# Patient Record
Sex: Female | Born: 1970 | Race: White | Hispanic: No | Marital: Married | Smoking: Never smoker
Health system: Southern US, Community
[De-identification: ages and names within clinical notes are randomized; demographics above are authoritative.]

## PROBLEM LIST (undated history)

## (undated) DIAGNOSIS — K56609 Unspecified intestinal obstruction, unspecified as to partial versus complete obstruction: Secondary | ICD-10-CM

## (undated) DIAGNOSIS — G935 Compression of brain: Secondary | ICD-10-CM

## (undated) HISTORY — PX: HERNIA REPAIR: SHX51

---

## 2005-02-21 ENCOUNTER — Emergency Department: Payer: Self-pay | Admitting: Emergency Medicine

## 2005-05-12 ENCOUNTER — Emergency Department: Payer: Self-pay | Admitting: General Practice

## 2007-06-19 ENCOUNTER — Emergency Department: Payer: Self-pay | Admitting: Emergency Medicine

## 2007-07-11 ENCOUNTER — Ambulatory Visit: Payer: Self-pay | Admitting: Orthopedic Surgery

## 2007-08-01 ENCOUNTER — Ambulatory Visit: Payer: Self-pay | Admitting: Orthopedic Surgery

## 2008-12-09 ENCOUNTER — Inpatient Hospital Stay: Payer: Self-pay | Admitting: Vascular Surgery

## 2011-08-31 ENCOUNTER — Emergency Department: Payer: Self-pay | Admitting: *Deleted

## 2011-08-31 LAB — URINALYSIS, COMPLETE
Bilirubin,UR: NEGATIVE
Ketone: NEGATIVE
Nitrite: NEGATIVE
Ph: 5 (ref 4.5–8.0)
Squamous Epithelial: 7

## 2011-08-31 LAB — COMPREHENSIVE METABOLIC PANEL
Alkaline Phosphatase: 48 U/L — ABNORMAL LOW (ref 50–136)
BUN: 10 mg/dL (ref 7–18)
Bilirubin,Total: 0.5 mg/dL (ref 0.2–1.0)
Calcium, Total: 8.4 mg/dL — ABNORMAL LOW (ref 8.5–10.1)
Chloride: 110 mmol/L — ABNORMAL HIGH (ref 98–107)
Co2: 26 mmol/L (ref 21–32)
Creatinine: 0.7 mg/dL (ref 0.60–1.30)
EGFR (African American): >60
EGFR (Non-African Amer.): >60
Potassium: 3.8 mmol/L (ref 3.5–5.1)
Sodium: 142 mmol/L (ref 136–145)

## 2011-08-31 LAB — CBC
HGB: 14.2 g/dL (ref 12.0–16.0)
MCH: 29.4 pg (ref 26.0–34.0)
MCHC: 34.1 g/dL (ref 32.0–36.0)
MCV: 86 fL (ref 80–100)
Platelet: 194 10*3/uL (ref 150–440)
WBC: 7.4 10*3/uL (ref 3.6–11.0)

## 2011-08-31 LAB — LIPASE, BLOOD: Lipase: 151 U/L (ref 73–393)

## 2011-08-31 LAB — PREGNANCY, URINE: Pregnancy Test, Urine: NEGATIVE m[IU]/mL

## 2013-09-26 DIAGNOSIS — K589 Irritable bowel syndrome without diarrhea: Secondary | ICD-10-CM | POA: Insufficient documentation

## 2013-09-26 DIAGNOSIS — O9279 Other disorders of lactation: Secondary | ICD-10-CM | POA: Insufficient documentation

## 2013-09-26 DIAGNOSIS — O901 Disruption of perineal obstetric wound: Secondary | ICD-10-CM | POA: Insufficient documentation

## 2016-09-21 ENCOUNTER — Other Ambulatory Visit: Payer: Self-pay | Admitting: Family Medicine

## 2016-09-21 DIAGNOSIS — Z1231 Encounter for screening mammogram for malignant neoplasm of breast: Secondary | ICD-10-CM

## 2016-10-10 ENCOUNTER — Encounter (HOSPITAL_COMMUNITY): Payer: Self-pay

## 2016-10-10 ENCOUNTER — Ambulatory Visit
Admission: RE | Admit: 2016-10-10 | Discharge: 2016-10-10 | Disposition: A | Discharge status: Home / Self Care | Payer: Medicaid Other | Source: Ambulatory Visit | Attending: Family Medicine | Admitting: Family Medicine

## 2016-10-10 DIAGNOSIS — Z1231 Encounter for screening mammogram for malignant neoplasm of breast: Secondary | ICD-10-CM | POA: Diagnosis present

## 2016-10-20 ENCOUNTER — Other Ambulatory Visit: Payer: Self-pay | Admitting: Family Medicine

## 2016-10-20 DIAGNOSIS — R1012 Left upper quadrant pain: Secondary | ICD-10-CM

## 2016-10-30 ENCOUNTER — Ambulatory Visit
Admission: RE | Admit: 2016-10-30 | Discharge: 2016-10-30 | Disposition: A | Discharge status: Home / Self Care | Payer: Medicaid Other | Source: Ambulatory Visit | Attending: Family Medicine | Admitting: Family Medicine

## 2016-11-07 ENCOUNTER — Ambulatory Visit
Admission: RE | Admit: 2016-11-07 | Discharge: 2016-11-07 | Disposition: A | Discharge status: Home / Self Care | Payer: Medicaid Other | Source: Ambulatory Visit | Attending: Family Medicine | Admitting: Family Medicine

## 2016-11-07 DIAGNOSIS — R1012 Left upper quadrant pain: Secondary | ICD-10-CM | POA: Insufficient documentation

## 2016-11-07 MED ORDER — IOPAMIDOL (ISOVUE-370) INJECTION 76%
100.0000 mL | Freq: Once | INTRAVENOUS | Status: AC | PRN
  Administered 2016-11-07: 100 mL via INTRAVENOUS

## 2018-08-04 ENCOUNTER — Telehealth: Payer: Medicaid Other | Admitting: Family

## 2018-08-04 DIAGNOSIS — Z20822 Contact with and (suspected) exposure to covid-19: Secondary | ICD-10-CM

## 2018-08-04 MED ORDER — BENZONATATE 100 MG PO CAPS: 100.0000 mg | ORAL_CAPSULE | Freq: Three times a day (TID) | ORAL | 0 refills | Status: DC | PRN

## 2018-08-04 NOTE — Progress Notes (Signed)
E-Visit for Corona Virus Screening   Your current symptoms could be consistent with the coronavirus.  I have sent your note to our Community Testing site. They should reach out to you in the next 24 hours to set up a time for testing if you qualify.  Approximately 5 minutes was spent documenting and reviewing patient's chart.    COVID-19 is a respiratory illness with symptoms that are similar to the flu. Symptoms are typically mild to moderate, but there have been cases of severe illness and death due to the virus. The following symptoms may appear 2-14 days after exposure: . Fever . Cough . Shortness of breath or difficulty breathing . Chills . Repeated shaking with chills . Muscle pain . Headache . Sore throat . New loss of taste or smell . Fatigue . Congestion or runny nose . Nausea or vomiting . Diarrhea  It is vitally important that if you feel that you have an infection such as this virus or any other virus that you stay home and away from places where you may spread it to others.  You should self-quarantine for 14 days if you have symptoms that could potentially be coronavirus or have been in close contact a with a person diagnosed with COVID-19 within the last 2 weeks. You should avoid contact with people age 65 and older.   You should wear a mask or cloth face covering over your nose and mouth if you must be around other people or animals, including pets (even at home). Try to stay at least 6 feet away from other people. This will protect the people around you.  You can use medication such as A prescription cough medication called Tessalon Perles 100 mg. You may take 1-2 capsules every 8 hours as needed for cough  You may also take acetaminophen (Tylenol) as needed for fever.   Reduce your risk of any infection by using the same precautions used for avoiding the common cold or flu:  . Wash your hands often with soap and warm water for at least 20 seconds.  If soap and water  are not readily available, use an alcohol-based hand sanitizer with at least 60% alcohol.  . If coughing or sneezing, cover your mouth and nose by coughing or sneezing into the elbow areas of your shirt or coat, into a tissue or into your sleeve (not your hands). . Avoid shaking hands with others and consider head nods or verbal greetings only. . Avoid touching your eyes, nose, or mouth with unwashed hands.  . Avoid close contact with people who are sick. . Avoid places or events with large numbers of people in one location, like concerts or sporting events. . Carefully consider travel plans you have or are making. . If you are planning any travel outside or inside the [place], visit the CDC's Travelers' Health webpage for the latest health notices. . If you have some symptoms but not all symptoms, continue to monitor at home and seek medical attention if your symptoms worsen. . If you are having a medical emergency, call 911.  HOME CARE . Only take medications as instructed by your medical team. . Drink plenty of fluids and get plenty of rest. . A steam or ultrasonic humidifier can help if you have congestion.   GET HELP RIGHT AWAY IF YOU HAVE EMERGENCY WARNING SIGNS** FOR COVID-19. If you or someone is showing any of these signs seek emergency medical care immediately. Call 911 or proceed to your closest emergency   facility if: . You develop worsening high fever. . Trouble breathing . Bluish lips or face . Persistent pain or pressure in the chest . New confusion . Inability to wake or stay awake . You cough up blood. . Your symptoms become more severe  **This list is not all possible symptoms. Contact your medical provider for any symptoms that are sever or concerning to you.   MAKE SURE YOU   Understand these instructions.  Will watch your condition.  Will get help right away if you are not doing well or get worse.  Your e-visit answers were reviewed by a board certified advanced  clinical practitioner to complete your personal care plan.  Depending on the condition, your plan could have included both over the counter or prescription medications.  If there is a problem please reply once you have received a response from your provider.  Your safety is important to us.  If you have drug allergies check your prescription carefully.    You can use MyChart to ask questions about today's visit, request a non-urgent call back, or ask for a work or school excuse for 24 hours related to this e-Visit. If it has been greater than 24 hours you will need to follow up with your provider, or enter a new e-Visit to address those concerns. You will get an e-mail in the next two days asking about your experience.  I hope that your e-visit has been valuable and will speed your recovery. Thank you for using e-visits. 

## 2018-11-28 ENCOUNTER — Other Ambulatory Visit: Payer: Self-pay

## 2018-11-28 DIAGNOSIS — Z20822 Contact with and (suspected) exposure to covid-19: Secondary | ICD-10-CM

## 2018-11-30 LAB — NOVEL CORONAVIRUS, NAA: SARS-CoV-2, NAA: NOT DETECTED

## 2018-12-25 ENCOUNTER — Other Ambulatory Visit: Payer: Self-pay

## 2018-12-25 ENCOUNTER — Emergency Department
Admission: EM | Admit: 2018-12-25 | Discharge: 2018-12-25 | Disposition: A | Discharge status: Home / Self Care | Payer: Medicaid Other | Attending: Emergency Medicine | Admitting: Emergency Medicine

## 2018-12-25 ENCOUNTER — Encounter: Payer: Self-pay | Admitting: Emergency Medicine

## 2018-12-25 ENCOUNTER — Emergency Department: Payer: Medicaid Other

## 2018-12-25 DIAGNOSIS — K429 Umbilical hernia without obstruction or gangrene: Secondary | ICD-10-CM | POA: Diagnosis not present

## 2018-12-25 DIAGNOSIS — N3000 Acute cystitis without hematuria: Secondary | ICD-10-CM | POA: Insufficient documentation

## 2018-12-25 DIAGNOSIS — R5383 Other fatigue: Secondary | ICD-10-CM | POA: Diagnosis not present

## 2018-12-25 DIAGNOSIS — R1084 Generalized abdominal pain: Secondary | ICD-10-CM | POA: Insufficient documentation

## 2018-12-25 DIAGNOSIS — Z79899 Other long term (current) drug therapy: Secondary | ICD-10-CM | POA: Insufficient documentation

## 2018-12-25 DIAGNOSIS — Z20828 Contact with and (suspected) exposure to other viral communicable diseases: Secondary | ICD-10-CM | POA: Insufficient documentation

## 2018-12-25 DIAGNOSIS — R109 Unspecified abdominal pain: Secondary | ICD-10-CM | POA: Diagnosis present

## 2018-12-25 LAB — CBC WITH DIFFERENTIAL/PLATELET
Abs Immature Granulocytes: 0.02 10*3/uL (ref 0.00–0.07)
Basophils Absolute: 0 10*3/uL (ref 0.0–0.1)
Basophils Relative: 0 %
Eosinophils Absolute: 0.2 10*3/uL (ref 0.0–0.5)
Eosinophils Relative: 2 %
HCT: 44.7 % (ref 36.0–46.0)
Hemoglobin: 14.8 g/dL (ref 12.0–15.0)
Immature Granulocytes: 0 %
Lymphocytes Relative: 36 %
Lymphs Abs: 3.2 10*3/uL (ref 0.7–4.0)
MCH: 29.2 pg (ref 26.0–34.0)
MCHC: 33.1 g/dL (ref 30.0–36.0)
MCV: 88.2 fL (ref 80.0–100.0)
Monocytes Absolute: 0.6 10*3/uL (ref 0.1–1.0)
Monocytes Relative: 7 %
Neutro Abs: 4.9 10*3/uL (ref 1.7–7.7)
Neutrophils Relative %: 55 %
Platelets: 197 10*3/uL (ref 150–400)
RBC: 5.07 MIL/uL (ref 3.87–5.11)
RDW: 13.3 % (ref 11.5–15.5)
WBC: 9 10*3/uL (ref 4.0–10.5)
nRBC: 0 % (ref 0.0–0.2)

## 2018-12-25 LAB — URINALYSIS, COMPLETE (UACMP) WITH MICROSCOPIC
Bilirubin Urine: NEGATIVE
Glucose, UA: NEGATIVE mg/dL
Ketones, ur: NEGATIVE mg/dL
Leukocytes,Ua: NEGATIVE
Nitrite: NEGATIVE
Protein, ur: 100 mg/dL — AB
RBC / HPF: >50 RBC/hpf — ABNORMAL HIGH (ref 0–5)
Specific Gravity, Urine: 1.009 (ref 1.005–1.030)
WBC, UA: >50 WBC/hpf — ABNORMAL HIGH (ref 0–5)
pH: 6 (ref 5.0–8.0)

## 2018-12-25 LAB — COMPREHENSIVE METABOLIC PANEL
ALT: 18 U/L (ref 0–44)
AST: 19 U/L (ref 15–41)
Albumin: 3.9 g/dL (ref 3.5–5.0)
Alkaline Phosphatase: 44 U/L (ref 38–126)
Anion gap: 8 (ref 5–15)
BUN: 9 mg/dL (ref 6–20)
CO2: 25 mmol/L (ref 22–32)
Calcium: 8.9 mg/dL (ref 8.9–10.3)
Chloride: 104 mmol/L (ref 98–111)
Creatinine, Ser: 0.75 mg/dL (ref 0.44–1.00)
GFR calc Af Amer: >60 mL/min (ref >60)
GFR calc non Af Amer: >60 mL/min (ref >60)
Glucose, Bld: 122 mg/dL — ABNORMAL HIGH (ref 70–99)
Potassium: 3.6 mmol/L (ref 3.5–5.1)
Sodium: 137 mmol/L (ref 135–145)
Total Bilirubin: 2 mg/dL — ABNORMAL HIGH (ref 0.3–1.2)
Total Protein: 6.6 g/dL (ref 6.5–8.1)

## 2018-12-25 LAB — POCT PREGNANCY, URINE: Preg Test, Ur: NEGATIVE

## 2018-12-25 LAB — LIPASE, BLOOD: Lipase: 28 U/L (ref 11–51)

## 2018-12-25 MED ORDER — CEPHALEXIN 500 MG PO CAPS: 500.0000 mg | ORAL_CAPSULE | Freq: Three times a day (TID) | ORAL | 0 refills | Status: DC

## 2018-12-25 MED ORDER — IOHEXOL 300 MG/ML  SOLN
125.0000 mL | Freq: Once | INTRAMUSCULAR | Status: AC | PRN
  Administered 2018-12-25: 125 mL via INTRAVENOUS
  Filled 2018-12-25: qty 125

## 2018-12-25 MED ORDER — OXYCODONE-ACETAMINOPHEN 5-325 MG PO TABS
2.0000 | ORAL_TABLET | Freq: Once | ORAL | Status: AC
  Administered 2018-12-25: 2 via ORAL
  Filled 2018-12-25: qty 2

## 2018-12-25 MED ORDER — KETOROLAC TROMETHAMINE 30 MG/ML IJ SOLN
30.0000 mg | Freq: Once | INTRAMUSCULAR | Status: AC
  Administered 2018-12-25: 22:00:00 30 mg via INTRAVENOUS
  Filled 2018-12-25: qty 1

## 2018-12-25 MED ORDER — ONDANSETRON HCL 4 MG/2ML IJ SOLN
4.0000 mg | Freq: Once | INTRAMUSCULAR | Status: AC
  Administered 2018-12-25: 21:00:00 4 mg via INTRAVENOUS
  Filled 2018-12-25: qty 2

## 2018-12-25 MED ORDER — SODIUM CHLORIDE 0.9 % IV SOLN
2.0000 g | Freq: Once | INTRAVENOUS | Status: AC
  Administered 2018-12-25: 2 g via INTRAVENOUS
  Filled 2018-12-25: qty 20

## 2018-12-25 MED ORDER — ONDANSETRON 4 MG PO TBDP: 4.0000 mg | ORAL_TABLET | Freq: Three times a day (TID) | ORAL | 0 refills | Status: DC | PRN

## 2018-12-25 MED ORDER — MORPHINE SULFATE (PF) 4 MG/ML IV SOLN
6.0000 mg | Freq: Once | INTRAVENOUS | Status: AC
  Administered 2018-12-25: 21:00:00 6 mg via INTRAVENOUS
  Filled 2018-12-25: qty 2

## 2018-12-25 MED ORDER — SODIUM CHLORIDE 0.9 % IV BOLUS
1000.0000 mL | Freq: Once | INTRAVENOUS | Status: AC
  Administered 2018-12-25: 1000 mL via INTRAVENOUS

## 2018-12-25 MED ORDER — HYDROCODONE-ACETAMINOPHEN 5-325 MG PO TABS: 1.0000 | ORAL_TABLET | Freq: Four times a day (QID) | ORAL | 0 refills | Status: DC | PRN

## 2018-12-25 NOTE — Discharge Instructions (Signed)
As we discussed, your labs and imaging overall were reassuring  Your urine did show a urine tract infection, which could certainly be causing some of your pain. Take the antibiotic as prescribed.  Your hernia does show a small area that has recurred. Discuss with your primary to set up a follow-up with a surgeon.  Take the pain medications as prescribed.  Consider taking a stool softener to help with your constipation.

## 2018-12-25 NOTE — ED Triage Notes (Signed)
Patient ambulatory to triage with steady gait, without difficulty or distress noted, mask in place; pt st yesterday began having lower abd pain radiating into vaginal area accomp by N/V

## 2018-12-25 NOTE — ED Notes (Signed)
Patient transported to CT 

## 2018-12-25 NOTE — ED Provider Notes (Signed)
[facility] Emergency Department Provider Note  ____________________________________________   First MD Initiated Contact with Patient 12/25/18 2024     (approximate)  I have reviewed the triage vital signs and the nursing notes.   HISTORY  Chief Complaint Abdominal Pain    HPI Shelia Cross is a 48 y.o. female  Here with abdominal pain. Pt reports that starting yesterday afternoon, she developed acute onset of severe, initially LUQ then diffuse abdominal pain, which she describes as a sharp, colicky pain that radiated down toward her groin. She has had persistent aching diffuse lower abd pain since then, which is intermittently worsening w/ sharp exacerbations. She's had associated nausea and inability to drink/eat. She has had associated constipation as well with no stool out since Monday. Denies h/o similar sx. No urinary sx. She is on her period but otherwise no vaginal discharge. No fevers, chills.        History reviewed. No pertinent past medical history.  There are no active problems to display for this patient.   Past Surgical History:  Procedure Laterality Date   HERNIA REPAIR      Prior to Admission medications   Medication Sig Start Date End Date Taking? Authorizing Provider  benzonatate (TESSALON PERLES) 100 MG capsule Take 1 capsule (100 mg total) by mouth 3 (three) times daily as needed. 08/04/18   Junie Spencer, FNP  cephALEXin (KEFLEX) 500 MG capsule Take 1 capsule (500 mg total) by mouth 3 (three) times daily for 10 days. 12/25/18 01/04/19  Shaune Pollack, MD  HYDROcodone-acetaminophen (NORCO/VICODIN) 5-325 MG tablet Take 1-2 tablets by mouth every 6 (six) hours as needed for moderate pain or severe pain. 12/25/18 12/25/19  Shaune Pollack, MD  ondansetron (ZOFRAN ODT) 4 MG disintegrating tablet Take 1 tablet (4 mg total) by mouth every 8 (eight) hours as needed for nausea or vomiting. 12/25/18   Shaune Pollack, MD     Allergies Patient has no known allergies.  Family History  Problem Relation Age of Onset   Breast cancer Maternal Aunt     Social History Social History   Tobacco Use   Smoking status: Never Smoker   Smokeless tobacco: Never Used  Substance Use Topics   Alcohol use: Not on file   Drug use: Not on file    Review of Systems  Review of Systems  Constitutional: Positive for fatigue. Negative for fever.  HENT: Negative for congestion and sore throat.   Eyes: Negative for visual disturbance.  Respiratory: Negative for cough and shortness of breath.   Cardiovascular: Negative for chest pain.  Gastrointestinal: Positive for abdominal distention, abdominal pain, constipation and nausea. Negative for diarrhea and vomiting.  Genitourinary: Negative for flank pain.  Musculoskeletal: Negative for back pain and neck pain.  Skin: Negative for rash and wound.  Neurological: Positive for weakness.  All other systems reviewed and are negative.    ____________________________________________  PHYSICAL EXAM:      VITAL SIGNS: ED Triage Vitals [12/25/18 1913]  Enc Vitals Group     BP (!) 155/84     Pulse Rate 73     Resp 20     Temp 97.6 F (36.4 C)     Temp Source Oral     SpO2 97 %     Weight (!) 370 lb (167.8 kg)     Height 5\' 4"  (1.626 m)     Head Circumference      Peak Flow      Pain Score 5  Pain Loc      Pain Edu?      Excl. in GC?      Physical Exam Vitals signs and nursing note reviewed.  Constitutional:      General: She is not in acute distress.    Appearance: She is well-developed.  HENT:     Head: Normocephalic and atraumatic.  Eyes:     Conjunctiva/sclera: Conjunctivae normal.  Neck:     Musculoskeletal: Neck supple.  Cardiovascular:     Rate and Rhythm: Normal rate and regular rhythm.     Heart sounds: Normal heart sounds. No murmur. No friction rub.  Pulmonary:     Effort: Pulmonary effort is normal. No respiratory distress.      Breath sounds: Normal breath sounds. No wheezing or rales.  Abdominal:     General: There is distension.     Palpations: Abdomen is soft.     Tenderness: There is abdominal tenderness.     Comments: Morbidly obese, with habitus limiting exam somewhat. Moderate midline and suprapubic TTP, with LLQ TTP. No guarding or rebound. No skin changes.  Skin:    General: Skin is warm.     Capillary Refill: Capillary refill takes less than 2 seconds.  Neurological:     Mental Status: She is alert and oriented to person, place, and time.     Motor: No abnormal muscle tone.       ____________________________________________   LABS (all labs ordered are listed, but only abnormal results are displayed)  Labs Reviewed  COMPREHENSIVE METABOLIC PANEL - Abnormal; Notable for the following components:      Result Value   Glucose, Bld 122 (*)    Total Bilirubin 2.0 (*)    All other components within normal limits  URINALYSIS, COMPLETE (UACMP) WITH MICROSCOPIC - Abnormal; Notable for the following components:   Color, Urine RED (*)    APPearance HAZY (*)    Hgb urine dipstick LARGE (*)    Protein, ur 100 (*)    RBC / HPF >50 (*)    WBC, UA >50 (*)    Bacteria, UA RARE (*)    All other components within normal limits  SARS CORONAVIRUS 2 (TAT 6-24 HRS)  CBC WITH DIFFERENTIAL/PLATELET  LIPASE, BLOOD  POCT PREGNANCY, URINE    ____________________________________________  EKG: None ________________________________________  RADIOLOGY All imaging, including plain films, CT scans, and ultrasounds, independently reviewed by me, and interpretations confirmed via formal radiology reads.  ED MD interpretation:   CT: Pending  Official radiology report(s): Ct Abdomen Pelvis W Contrast  Result Date: 12/25/2018 CLINICAL DATA:  Distension with vomiting EXAM: CT ABDOMEN AND PELVIS WITH CONTRAST TECHNIQUE: Multidetector CT imaging of the abdomen and pelvis was performed using the standard protocol  following bolus administration of intravenous contrast. CONTRAST:  125mL OMNIPAQUE IOHEXOL 300 MG/ML  SOLN COMPARISON:  11/07/2016 CT, 09/01/2011, 12/09/2008 FINDINGS: Lower chest: Lung bases demonstrate no acute consolidation or effusion. The heart size is normal. Hepatobiliary: No focal liver abnormality is seen. No gallstones, gallbladder wall thickening, or biliary dilatation. Pancreas: Unremarkable. No pancreatic ductal dilatation or surrounding inflammatory changes. Spleen: Normal in size without focal abnormality. Adrenals/Urinary Tract: Adrenal glands are unremarkable. Kidneys are normal, without renal calculi, focal lesion, or hydronephrosis. Bladder is unremarkable. Stomach/Bowel: Stomach is within normal limits. Appendix appears normal. No evidence of bowel wall thickening, distention, or inflammatory changes. Vascular/Lymphatic: Nonaneurysmal aorta.  No significant adenopathy Reproductive: Uterus and bilateral adnexa are unremarkable. Other: Negative for free air or free fluid.  Evidence of prior periumbilical hernia repair. Recurrent hernia in the infraumbilical region, best seen on sagittal views, series 7, image number 52. There is stranding and fluid within the hernia sac. No incarcerated bowel. No evidence for obstruction. Marked skin thickening and edema within the inferior pannus. Musculoskeletal: Degenerative changes.  No acute osseous abnormality IMPRESSION: 1. Negative for a bowel obstruction or bowel wall thickening. 2. Evidence of prior periumbilical hernia repair. Findings suspicious for recurrent infraumbilical hernia with some soft tissue inflammatory changes and fluid present in the hernia sac. No incarcerated bowel, and no evidence for obstruction related to the hernia. 3. Marked skin thickening and subcutaneous edema within the inferior pannus, correlate with physical examination Electronically Signed   By: Donavan Foil M.D.   On: 12/25/2018 21:58     ____________________________________________  PROCEDURES   Procedure(s) performed (including Critical Care):  Procedures  ____________________________________________  INITIAL IMPRESSION / MDM / [place] / ED COURSE  As part of my medical decision making, I reviewed the following data within the [place] notes reviewed and incorporated, Old chart reviewed, Notes from prior ED visits, and [place] Controlled Substance Kemp was evaluated in Emergency Department on 12/25/2018 for the symptoms described in the history of present illness. She was evaluated in the context of the global COVID-19 pandemic, which necessitated consideration that the patient might be at risk for infection with the SARS-CoV-2 virus that causes COVID-19. Institutional protocols and algorithms that pertain to the evaluation of patients at risk for COVID-19 are in a state of rapid change based on information released by regulatory bodies including the CDC and federal and state organizations. These policies and algorithms were followed during the patient's care in the ED.  Some ED evaluations and interventions may be delayed as a result of limited staffing during the pandemic.*     Medical Decision Making:  48 yo F here with diffuse, stabbing abdominal pain, nausea, constipation. DDx includes SBO, diverticulitis, renal stone, UTI with bladder spasm, hernia mesh complication, colitis, enteritis. Labs are overall reassuring - normal WBC, normal renal function. UPT neg. UA does show pyuria, hematuria though multiple squams. Will tx as UTI if imaging shows no alternative etiology for her pain. CT imaging pending.  CT scan as above, reviewed by me. Pt noted to have small area of stranding and fluid in recurrent umbilical hernia sac. Suspect this could be an area of fat necrosis. Edema noted and stranding in pannus wall but on exam, she has edema but no erythema, no  TTP. She has a h/o anasarca and abd wall edema and I do not feel this represents a significant panniculitis or infectious process at this time.  Discussed CT findings with Dr. Celine Ahr of Gen Surgery, appreciate her recommendations. No surgical intervention needed at this time. Will treat for UTI, give analgesics for her possible fat necrosis and symptomatic hernia, and refer for outpt follow-up.   ____________________________________________  FINAL CLINICAL IMPRESSION(S) / ED DIAGNOSES  Final diagnoses:  Generalized abdominal pain  Acute cystitis without hematuria  Recurrent umbilical hernia     MEDICATIONS GIVEN DURING THIS VISIT:  Medications  sodium chloride 0.9 % bolus 1,000 mL (0 mLs Intravenous Stopped 12/25/18 2313)  ondansetron (ZOFRAN) injection 4 mg (4 mg Intravenous Given 12/25/18 2119)  morphine 4 MG/ML injection 6 mg (6 mg Intravenous Given 12/25/18 2116)  iohexol (OMNIPAQUE) 300 MG/ML solution 125 mL (125 mLs Intravenous Contrast Given 12/25/18 2138)  ketorolac (TORADOL) 30 MG/ML injection 30 mg (30 mg Intravenous Given 12/25/18 2218)  oxyCODONE-acetaminophen (PERCOCET/ROXICET) 5-325 MG per tablet 2 tablet (2 tablets Oral Given 12/25/18 2240)  cefTRIAXone (ROCEPHIN) 2 g in sodium chloride 0.9 % 100 mL IVPB (0 g Intravenous Stopped 12/25/18 2313)     ED Discharge Orders         Ordered    HYDROcodone-acetaminophen (NORCO/VICODIN) 5-325 MG tablet  Every 6 hours PRN     12/25/18 2221    cephALEXin (KEFLEX) 500 MG capsule  3 times daily     12/25/18 2221    ondansetron (ZOFRAN ODT) 4 MG disintegrating tablet  Every 8 hours PRN     12/25/18 2221           Note:  This document was prepared using Dragon voice recognition software and may include unintentional dictation errors.   Shaune Pollack, MD 12/25/18 [phone]

## 2018-12-25 NOTE — ED Provider Notes (Signed)
I discussed case with Dr. Ellender Hose.  CT abdomen and pelvis results reviewed by general surgery who indicated no need for acute surgical intervention.  Discussed imaging results with patient.  Patient will be discharged home with cephalexin, Norco, Zofran for UTI.  Urine culture is pending.  Patient's vital signs are stable patient understands signs and symptoms return to the ED for.   Duanne Guess, PA-C 12/25/18 2257    Duffy Bruce, MD 12/30/18 [phone]

## 2018-12-26 LAB — SARS CORONAVIRUS 2 (TAT 6-24 HRS): SARS Coronavirus 2: NEGATIVE

## 2018-12-27 ENCOUNTER — Other Ambulatory Visit: Payer: Self-pay | Admitting: Family Medicine

## 2018-12-27 DIAGNOSIS — Z0000 Encounter for general adult medical examination without abnormal findings: Secondary | ICD-10-CM

## 2018-12-27 DIAGNOSIS — Z1231 Encounter for screening mammogram for malignant neoplasm of breast: Secondary | ICD-10-CM

## 2019-01-01 ENCOUNTER — Inpatient Hospital Stay
Admission: EM | Admit: 2019-01-01 | Discharge: 2019-01-07 | DRG: 389 | Disposition: A | Discharge status: Home / Self Care | Payer: Medicaid Other | Attending: Internal Medicine | Admitting: Internal Medicine

## 2019-01-01 ENCOUNTER — Other Ambulatory Visit: Payer: Self-pay

## 2019-01-01 DIAGNOSIS — Z978 Presence of other specified devices: Secondary | ICD-10-CM

## 2019-01-01 DIAGNOSIS — K56609 Unspecified intestinal obstruction, unspecified as to partial versus complete obstruction: Secondary | ICD-10-CM

## 2019-01-01 DIAGNOSIS — Z8719 Personal history of other diseases of the digestive system: Secondary | ICD-10-CM

## 2019-01-01 DIAGNOSIS — Z87440 Personal history of urinary (tract) infections: Secondary | ICD-10-CM

## 2019-01-01 DIAGNOSIS — K565 Intestinal adhesions [bands], unspecified as to partial versus complete obstruction: Principal | ICD-10-CM | POA: Diagnosis present

## 2019-01-01 DIAGNOSIS — Z803 Family history of malignant neoplasm of breast: Secondary | ICD-10-CM

## 2019-01-01 DIAGNOSIS — K589 Irritable bowel syndrome without diarrhea: Secondary | ICD-10-CM | POA: Diagnosis present

## 2019-01-01 DIAGNOSIS — N83209 Unspecified ovarian cyst, unspecified side: Secondary | ICD-10-CM | POA: Diagnosis present

## 2019-01-01 DIAGNOSIS — N83201 Unspecified ovarian cyst, right side: Secondary | ICD-10-CM | POA: Diagnosis present

## 2019-01-01 DIAGNOSIS — K429 Umbilical hernia without obstruction or gangrene: Secondary | ICD-10-CM | POA: Diagnosis present

## 2019-01-01 DIAGNOSIS — Z20828 Contact with and (suspected) exposure to other viral communicable diseases: Secondary | ICD-10-CM | POA: Diagnosis present

## 2019-01-01 DIAGNOSIS — Z6841 Body Mass Index (BMI) 40.0 and over, adult: Secondary | ICD-10-CM

## 2019-01-01 HISTORY — DX: Morbid (severe) obesity due to excess calories: E66.01

## 2019-01-01 LAB — COMPREHENSIVE METABOLIC PANEL
ALT: 19 U/L (ref 0–44)
AST: 19 U/L (ref 15–41)
Albumin: 4 g/dL (ref 3.5–5.0)
Alkaline Phosphatase: 43 U/L (ref 38–126)
Anion gap: 9 (ref 5–15)
BUN: 9 mg/dL (ref 6–20)
CO2: 23 mmol/L (ref 22–32)
Calcium: 9.1 mg/dL (ref 8.9–10.3)
Chloride: 106 mmol/L (ref 98–111)
Creatinine, Ser: 0.55 mg/dL (ref 0.44–1.00)
GFR calc Af Amer: >60 mL/min (ref >60)
GFR calc non Af Amer: >60 mL/min (ref >60)
Glucose, Bld: 115 mg/dL — ABNORMAL HIGH (ref 70–99)
Potassium: 4 mmol/L (ref 3.5–5.1)
Sodium: 138 mmol/L (ref 135–145)
Total Bilirubin: 1.2 mg/dL (ref 0.3–1.2)
Total Protein: 7.1 g/dL (ref 6.5–8.1)

## 2019-01-01 LAB — CBC
HCT: 45.7 % (ref 36.0–46.0)
Hemoglobin: 15.5 g/dL — ABNORMAL HIGH (ref 12.0–15.0)
MCH: 29.5 pg (ref 26.0–34.0)
MCHC: 33.9 g/dL (ref 30.0–36.0)
MCV: 87 fL (ref 80.0–100.0)
Platelets: 229 10*3/uL (ref 150–400)
RBC: 5.25 MIL/uL — ABNORMAL HIGH (ref 3.87–5.11)
RDW: 13.2 % (ref 11.5–15.5)
WBC: 10 10*3/uL (ref 4.0–10.5)
nRBC: 0 % (ref 0.0–0.2)

## 2019-01-01 LAB — URINALYSIS, COMPLETE (UACMP) WITH MICROSCOPIC
Bacteria, UA: NONE SEEN
Bilirubin Urine: NEGATIVE
Glucose, UA: NEGATIVE mg/dL
Hgb urine dipstick: NEGATIVE
Ketones, ur: NEGATIVE mg/dL
Nitrite: NEGATIVE
Protein, ur: NEGATIVE mg/dL
Specific Gravity, Urine: 1.019 (ref 1.005–1.030)
pH: 6 (ref 5.0–8.0)

## 2019-01-01 LAB — POCT PREGNANCY, URINE: Preg Test, Ur: NEGATIVE

## 2019-01-01 LAB — LIPASE, BLOOD: Lipase: 29 U/L (ref 11–51)

## 2019-01-01 NOTE — ED Triage Notes (Signed)
Pt in with co mid abd pain here a week ago for the same. Dx with hernia, pt here for persistent pain.

## 2019-01-02 ENCOUNTER — Observation Stay: Payer: Medicaid Other

## 2019-01-02 ENCOUNTER — Encounter: Payer: Self-pay | Admitting: Radiology

## 2019-01-02 ENCOUNTER — Emergency Department: Payer: Medicaid Other

## 2019-01-02 DIAGNOSIS — K589 Irritable bowel syndrome without diarrhea: Secondary | ICD-10-CM | POA: Diagnosis present

## 2019-01-02 DIAGNOSIS — R112 Nausea with vomiting, unspecified: Secondary | ICD-10-CM | POA: Diagnosis present

## 2019-01-02 DIAGNOSIS — Z803 Family history of malignant neoplasm of breast: Secondary | ICD-10-CM | POA: Diagnosis not present

## 2019-01-02 DIAGNOSIS — Z8719 Personal history of other diseases of the digestive system: Secondary | ICD-10-CM | POA: Diagnosis not present

## 2019-01-02 DIAGNOSIS — Z6841 Body Mass Index (BMI) 40.0 and over, adult: Secondary | ICD-10-CM | POA: Diagnosis not present

## 2019-01-02 DIAGNOSIS — Z20828 Contact with and (suspected) exposure to other viral communicable diseases: Secondary | ICD-10-CM | POA: Diagnosis present

## 2019-01-02 DIAGNOSIS — K56609 Unspecified intestinal obstruction, unspecified as to partial versus complete obstruction: Secondary | ICD-10-CM | POA: Diagnosis not present

## 2019-01-02 DIAGNOSIS — K565 Intestinal adhesions [bands], unspecified as to partial versus complete obstruction: Secondary | ICD-10-CM | POA: Diagnosis present

## 2019-01-02 DIAGNOSIS — Z87440 Personal history of urinary (tract) infections: Secondary | ICD-10-CM | POA: Diagnosis not present

## 2019-01-02 DIAGNOSIS — N83201 Unspecified ovarian cyst, right side: Secondary | ICD-10-CM | POA: Diagnosis present

## 2019-01-02 DIAGNOSIS — K429 Umbilical hernia without obstruction or gangrene: Secondary | ICD-10-CM | POA: Diagnosis present

## 2019-01-02 LAB — HIV ANTIBODY (ROUTINE TESTING W REFLEX): HIV Screen 4th Generation wRfx: NONREACTIVE

## 2019-01-02 LAB — TROPONIN I (HIGH SENSITIVITY)
Troponin I (High Sensitivity): <2 ng/L (ref <18)
Troponin I (High Sensitivity): 2 ng/L (ref <18)

## 2019-01-02 MED ORDER — ONDANSETRON HCL 4 MG PO TABS: 4.0000 mg | ORAL_TABLET | Freq: Four times a day (QID) | ORAL | Status: DC | PRN

## 2019-01-02 MED ORDER — DEXTROSE IN LACTATED RINGERS 5 % IV SOLN
INTRAVENOUS | Status: DC
  Administered 2019-01-02 – 2019-01-04 (×5): via INTRAVENOUS

## 2019-01-02 MED ORDER — MORPHINE SULFATE (PF) 4 MG/ML IV SOLN
4.0000 mg | INTRAVENOUS | Status: DC | PRN
  Administered 2019-01-02: 4 mg via INTRAVENOUS
  Filled 2019-01-02 (×2): qty 1

## 2019-01-02 MED ORDER — DIATRIZOATE MEGLUMINE & SODIUM 66-10 % PO SOLN
90.0000 mL | Freq: Once | ORAL | Status: AC
  Administered 2019-01-02: 90 mL via NASOGASTRIC

## 2019-01-02 MED ORDER — ONDANSETRON HCL 4 MG/2ML IJ SOLN
4.0000 mg | Freq: Four times a day (QID) | INTRAMUSCULAR | Status: DC | PRN
  Administered 2019-01-02 – 2019-01-03 (×2): 4 mg via INTRAVENOUS
  Filled 2019-01-02 (×2): qty 2

## 2019-01-02 MED ORDER — IOHEXOL 300 MG/ML  SOLN
100.0000 mL | Freq: Once | INTRAMUSCULAR | Status: DC | PRN
  Filled 2019-01-02: qty 100

## 2019-01-02 MED ORDER — MORPHINE SULFATE (PF) 4 MG/ML IV SOLN
4.0000 mg | Freq: Once | INTRAVENOUS | Status: AC
  Administered 2019-01-02: 4 mg via INTRAVENOUS
  Filled 2019-01-02: qty 1

## 2019-01-02 MED ORDER — ENOXAPARIN SODIUM 40 MG/0.4ML ~~LOC~~ SOLN
40.0000 mg | Freq: Two times a day (BID) | SUBCUTANEOUS | Status: DC
  Administered 2019-01-02 – 2019-01-04 (×4): 40 mg via SUBCUTANEOUS
  Filled 2019-01-02 (×11): qty 0.4

## 2019-01-02 MED ORDER — ONDANSETRON HCL 4 MG/2ML IJ SOLN
4.0000 mg | Freq: Once | INTRAMUSCULAR | Status: AC
  Administered 2019-01-02: 4 mg via INTRAVENOUS
  Filled 2019-01-02: qty 2

## 2019-01-02 MED ORDER — SODIUM CHLORIDE 0.9 % IV SOLN: INTRAVENOUS | Status: DC

## 2019-01-02 MED ORDER — IOHEXOL 300 MG/ML  SOLN
125.0000 mL | Freq: Once | INTRAMUSCULAR | Status: AC | PRN
  Administered 2019-01-02: 125 mL via INTRAVENOUS
  Filled 2019-01-02: qty 125

## 2019-01-02 NOTE — ED Notes (Signed)
Report called to [facility] in [place]. Awaiting surgeon to evaluate.

## 2019-01-02 NOTE — Progress Notes (Signed)
Follow up surgery Note  Patient was reevaluated this afternoon.  Patient reports she passed gas but still feeling nauseous.  She denies severe abdominal pain that she had when she came.  There is no pain radiation.  There is no alleviating or aggravating factor.  Vitals:   01/02/19 0514 01/02/19 1413  BP: 117/71 (!) 146/74  Pulse: (!) 56 63  Resp: 20   Temp: 97.6 F (36.4 C) 97.7 F (36.5 C)  SpO2: 100% 100%   Abdomen: Soft and depressible, mild tender to palpation in midline.  Morbid obese so difficult to appreciate distention.  Assessment and plan: Patient with small bowel obstruction.  NG tube was placed this morning.  I evaluated the abdominal x-ray that shows the NG tube in the stomach.  I will order Gastrografin challenge.  I had a long conversation with the patient and her husband about the plan of treatment.  They agreed with the current conservative management plan.  Herbert Pun, MD

## 2019-01-02 NOTE — Progress Notes (Signed)
Anticoagulation monitoring(Lovenox):  48yo  female ordered Lovenox 40 mg Q24h  Filed Weights   01/01/19 2124  Weight: (!) 370 lb (167.8 kg)   BMI 65.54   Lab Results  Component Value Date   CREATININE 0.55 01/01/2019   CREATININE 0.75 12/25/2018   CREATININE 0.70 08/31/2011   Estimated Creatinine Clearance: 131 mL/min (by C-G formula based on SCr of 0.55 mg/dL). Hemoglobin & Hematocrit     Component Value Date/Time   HGB 15.5 (H) 01/01/2019 2126   HGB 14.2 08/31/2011 2204   HCT 45.7 01/01/2019 2126   HCT 41.6 08/31/2011 2204     Per Protocol for Patient with estCrcl > 30 ml/min and BMI > 40, will transition to Lovenox 40 mg Q12h.

## 2019-01-02 NOTE — ED Provider Notes (Signed)
[facility] Emergency Department Provider Note  ____________________________________________  Time seen: Approximately 12:49 AM  I have reviewed the triage vital signs and the nursing notes.   HISTORY  Chief Complaint Abdominal Pain   HPI Shelia Cross is a 48 y.o. female with history of a ventral hernia who presents for evaluation of abdominal pain.  Patient was seen here 7 days ago  for similar pain.  Had a CT showing a recurrence of an infraumbilical hernia with inflammatory changes, containing fat only.  Patient was seen by surgery who recommended weight loss and outpatient follow-up.  Patient was doing well until today.  This morning she woke up with severe pain.  She has had severe nausea and a couple episodes of nonbloody nonbilious emesis.  She is complaining of severe pain that is sharp, located in the periumbilical region, constant and nonradiating.  She has taken Norco's at home with no significant improvement of the pain.  Last bowel movement was this morning.  She is passing flatus.  PMH Morbid obesity IBS  Past Surgical History:  Procedure Laterality Date  . HERNIA REPAIR      Prior to Admission medications   Medication Sig Start Date End Date Taking? Authorizing Provider  benzonatate (TESSALON PERLES) 100 MG capsule Take 1 capsule (100 mg total) by mouth 3 (three) times daily as needed. 08/04/18   Sharion Balloon, FNP  cephALEXin (KEFLEX) 500 MG capsule Take 1 capsule (500 mg total) by mouth 3 (three) times daily for 10 days. 12/25/18 01/04/19  Duffy Bruce, MD  HYDROcodone-acetaminophen (NORCO/VICODIN) 5-325 MG tablet Take 1-2 tablets by mouth every 6 (six) hours as needed for moderate pain or severe pain. 12/25/18 12/25/19  Duffy Bruce, MD  ondansetron (ZOFRAN ODT) 4 MG disintegrating tablet Take 1 tablet (4 mg total) by mouth every 8 (eight) hours as needed for nausea or vomiting. 12/25/18   Duffy Bruce, MD    Allergies Patient  has no known allergies.  Family History  Problem Relation Age of Onset  . Breast cancer Maternal Aunt     Social History Social History   Tobacco Use  . Smoking status: Never Smoker  . Smokeless tobacco: Never Used  Substance Use Topics  . Alcohol use: Not on file  . Drug use: Not on file    Review of Systems  Constitutional: Negative for fever. Eyes: Negative for visual changes. ENT: Negative for sore throat. Neck: No neck pain  Cardiovascular: Negative for chest pain. Respiratory: Negative for shortness of breath. Gastrointestinal: +abdominal pain, nausea, and vomiting. No diarrhea. Genitourinary: Negative for dysuria. Musculoskeletal: Negative for back pain. Skin: Negative for rash. Neurological: Negative for headaches, weakness or numbness. Psych: No SI or HI  ____________________________________________   PHYSICAL EXAM:  VITAL SIGNS: ED Triage Vitals  Enc Vitals Group     BP 01/01/19 2123 (!) 179/88     Pulse Rate 01/01/19 2123 70     Resp 01/01/19 2123 20     Temp 01/01/19 2123 97.8 F (36.6 C)     Temp Source 01/01/19 2123 Oral     SpO2 01/01/19 2123 98 %     Weight 01/01/19 2124 (!) 370 lb (167.8 kg)     Height 01/01/19 2124 5\' 3"  (1.6 m)     Head Circumference --      Peak Flow --      Pain Score 01/01/19 2123 8     Pain Loc --      Pain Edu? --  Excl. in GC? --     Constitutional: Alert and oriented. Well appearing and in no apparent distress. HEENT:      Head: Normocephalic and atraumatic.         Eyes: Conjunctivae are normal. Sclera is non-icteric.       Mouth/Throat: Mucous membranes are moist.       Neck: Supple with no signs of meningismus. Cardiovascular: Regular rate and rhythm. No murmurs, gallops, or rubs. 2+ symmetrical distal pulses are present in all extremities. No JVD. Respiratory: Normal respiratory effort. Lungs are clear to auscultation bilaterally. No wheezes, crackles, or rhonchi.  Gastrointestinal: Morbid obese  abdomen which limits exam however a small defect in the fascia is palpable with what it seems to be fat only, no overlying skin changes, this area is tender to palpation, normal bowel sounds, remaining of the abdomen is non tender Musculoskeletal: Nontender with normal range of motion in all extremities. No edema, cyanosis, or erythema of extremities. Neurologic: Normal speech and language. Face is symmetric. Moving all extremities. No gross focal neurologic deficits are appreciated. Skin: Skin is warm, dry and intact. No rash noted. Psychiatric: Mood and affect are normal. Speech and behavior are normal.  ____________________________________________   LABS (all labs ordered are listed, but only abnormal results are displayed)  Labs Reviewed  CBC - Abnormal; Notable for the following components:      Result Value   RBC 5.25 (*)    Hemoglobin 15.5 (*)    All other components within normal limits  COMPREHENSIVE METABOLIC PANEL - Abnormal; Notable for the following components:   Glucose, Bld 115 (*)    All other components within normal limits  URINALYSIS, COMPLETE (UACMP) WITH MICROSCOPIC - Abnormal; Notable for the following components:   Color, Urine YELLOW (*)    APPearance HAZY (*)    Leukocytes,Ua TRACE (*)    All other components within normal limits  LIPASE, BLOOD  POC URINE PREG, ED  POCT PREGNANCY, URINE   ____________________________________________  EKG  none ____________________________________________  RADIOLOGY  I have personally reviewed the images performed during this visit and I agree with the Radiologist's read.   Interpretation by Radiologist:  CT ABDOMEN PELVIS W CONTRAST  Result Date: 01/02/2019 CLINICAL DATA:  Abdominal pain. EXAM: CT ABDOMEN AND PELVIS WITH CONTRAST TECHNIQUE: Multidetector CT imaging of the abdomen and pelvis was performed using the standard protocol following bolus administration of intravenous contrast. CONTRAST:  125mL OMNIPAQUE  IOHEXOL 300 MG/ML  SOLN COMPARISON:  CT dated December 25, 2018 FINDINGS: Lower chest: The lung bases are clear. The heart size is normal. Hepatobiliary: There is decreased hepatic attenuation suggestive of hepatic steatosis. Normal gallbladder.There is no biliary ductal dilation. Pancreas: Normal contours without ductal dilatation. No peripancreatic fluid collection. Spleen: No splenic laceration or hematoma. Adrenals/Urinary Tract: --Adrenal glands: No adrenal hemorrhage. --Right kidney/ureter: No hydronephrosis or perinephric hematoma. --Left kidney/ureter: No hydronephrosis or perinephric hematoma. --Urinary bladder: Unremarkable. Stomach/Bowel: --Stomach/Duodenum: No hiatal hernia or other gastric abnormality. Normal duodenal course and caliber. --Small bowel: There are dilated loops of small bowel in the low midline abdomen measuring up to approximately 4 cm. These loops of bowel are adjacent to the patient's prior umbilical hernia repair. There is no pneumatosis or free air. --Colon: No focal abnormality. --Appendix: Normal. Vascular/Lymphatic: Normal course and caliber of the major abdominal vessels. --No retroperitoneal lymphadenopathy. --No mesenteric lymphadenopathy. --No pelvic or inguinal lymphadenopathy. Reproductive: There is a 5.1 cm right ovarian cystic mass. Other: No ascites or free air. Again  noted are findings of a possible recurrent infraumbilical hernia. There are phlegm a Gwyndolyn Kaufman changes about the prior umbilical hernia repair. There is a small pocket of fluid inferior to the mesh measuring approximately 4 cm. This is stable from prior study. Musculoskeletal. No acute displaced fractures. IMPRESSION: 1. There are dilated loops of small bowel in the low midline abdomen adjacent to the patient's prior umbilical hernia repair. Findings likely represent a developing small bowel obstruction secondary to adhesions. 2. Persistent inflammatory changes about the patient's prior umbilical hernia repair  with findings concerning for recurrent infraumbilical hernia as previously described. 3. There is a 5.1 cm right ovarian cystic mass. Further evaluation with an outpatient nonemergent ultrasound is recommended in 6-8 weeks. 4. Hepatic steatosis. Electronically Signed   By: Katherine Mantle M.D.   On: 01/02/2019 01:14      ____________________________________________   PROCEDURES  Procedure(s) performed: None Procedures Critical Care performed:  None ____________________________________________   INITIAL IMPRESSION / ASSESSMENT AND PLAN / ED COURSE  48 y.o. female with history of a ventral hernia who presents for evaluation of abdominal pain.  Unfortunately due to patient's body habitus exam is very limited.  It seems like patient has had significant increase in her pain, nausea and vomiting.  Therefore we will send patient for repeat CT to rule out incarcerated hernia versus strangulated hernia versus small bowel obstruction.  Will give morphine and Zofran for symptom relief.      _________________________ 3:10 AM on 01/02/2019 -----------------------------------------  CT consistent with developing SBO. Patient feels improved. No longer vomiting. Will consult surgery.    _________________________ 3:14 AM on 01/02/2019 -----------------------------------------  Discussed with Dr. Hazle Quant from surgery who recommended NG tube placement and admission to Hospitalist service.    As part of my medical decision making, I reviewed the following data within the electronic MEDICAL RECORD NUMBER Nursing notes reviewed and incorporated, Labs reviewed , Old chart reviewed, Radiograph reviewed , A consult was requested and obtained from this/these consultant(s) Surgery, Notes from prior ED visits and [place] Controlled Substance Database   Please note:  Patient was evaluated in Emergency Department today for the symptoms described in the history of present illness. Patient was evaluated in the  context of the global COVID-19 pandemic, which necessitated consideration that the patient might be at risk for infection with the SARS-CoV-2 virus that causes COVID-19. Institutional protocols and algorithms that pertain to the evaluation of patients at risk for COVID-19 are in a state of rapid change based on information released by regulatory bodies including the CDC and federal and state organizations. These policies and algorithms were followed during the patient's care in the ED.  Some ED evaluations and interventions may be delayed as a result of limited staffing during the pandemic.   ____________________________________________   FINAL CLINICAL IMPRESSION(S) / ED DIAGNOSES   Final diagnoses:  SBO (small bowel obstruction) (HCC)      NEW MEDICATIONS STARTED DURING THIS VISIT:  ED Discharge Orders    None       Note:  This document was prepared using Dragon voice recognition software and may include unintentional dictation errors.    Nita Sickle, MD 01/02/19 [phone]

## 2019-01-02 NOTE — H&P (Addendum)
History and Physical    Shelia Cross NFA:[id number] DOB: 1970/03/26 DOA: 01/01/2019  I have briefly reviewed the patient's prior medical records in [facility]  PCP: [facility], [place]  Patient coming from: home  Chief Complaint: Abdominal pain, nausea vomiting  HPI: Shelia Cross is a 48 y.o. female with medical history significant of morbid obesity, history of periumbilical hernia repair who presents to the hospital with chief complaint of lower abdominal pain, nausea and vomiting with onset 24 hours ago.  Patient was recently seen in the emergency room about a week ago at that time she underwent a CT scan of the abdomen pelvis which was negative for bowel obstruction, did show prior periumbilical hernia repair with findings recorded for recurrent infraumbilical hernia with some soft tissue inflammatory changes, but no evidence of incarcerated bowel.  She was discharged home, and apparently she was doing well over the last week, was able to eat small portions and had regular bowel movements, up until yesterday towards the end of the day when she started to feel worsening of her epigastric pain, much worse than last week, along with nausea and vomiting.  Her last bowel movement was yesterday morning and and she also noticed that she has not passing gas overnight last night.  She came to the emergency room, and per RN had 2 emesis episodes overnight.  Currently states that her pain is better, she denies any fever or chills, she denies any chest pain or breathing difficulties.  She tells me she was also diagnosed with a UTI last week and was taking antibiotics however was unable to take her antibiotic last night due to nausea and vomiting.  She currently denies any urinary symptoms, no dysuria, no foul-smelling urine or increased frequency.  ED Course: In the emergency room her vitals are stable she is satting well on room air, blood work is fairly unremarkable without any  leukocytosis.  CT scan of the abdomen pelvis did show distended dilated loops of small bowel adjacent to the prior umbilical hernia repair, possibly representing a small bowel obstruction secondary to adhesions.  General surgery was consulted and we are asked to admit  Review of Systems: All systems reviewed, and apart from HPI, all negative  History reviewed. No pertinent past medical history.  Past Surgical History:  Procedure Laterality Date   HERNIA REPAIR       reports that she has never smoked. She has never used smokeless tobacco. No history on file for alcohol and drug.  No Known Allergies  Family History  Problem Relation Age of Onset   Breast cancer Maternal Aunt     Prior to Admission medications   Medication Sig Start Date End Date Taking? Authorizing Provider  cephALEXin (KEFLEX) 500 MG capsule Take 1 capsule (500 mg total) by mouth 3 (three) times daily for 10 days. 12/25/18 01/04/19 Yes Duffy Bruce, MD  GARLIC OIL PO Take by mouth.   Yes [provider]  HYDROcodone-acetaminophen (NORCO/VICODIN) 5-325 MG tablet Take 1-2 tablets by mouth every 6 (six) hours as needed for moderate pain or severe pain. 12/25/18 12/25/19 Yes Duffy Bruce, MD  Lactobacillus (PROBIOTIC ACIDOPHILUS PO) Take 1 capsule by mouth daily.   Yes [provider]  ondansetron (ZOFRAN ODT) 4 MG disintegrating tablet Take 1 tablet (4 mg total) by mouth every 8 (eight) hours as needed for nausea or vomiting. 12/25/18  Earleen Newport, MD    Physical Exam: Vitals:   01/01/19 2123 01/01/19 2124 01/02/19  0315 01/02/19 0514  BP: (!) 179/88  128/73 117/71  Pulse: 70  62 (!) 56  Resp: 20  20 20   Temp: 97.8 F (36.6 C)   97.6 F (36.4 C)  TempSrc: Oral   Oral  SpO2: 98%  100% 100%  Weight:  (!) 167.8 kg    Height:  5\' 3"  (1.6 m)      Constitutional: NAD, calm, comfortable Eyes: PERRL, lids and conjunctivae normal ENMT: Mucous membranes are moist Neck: normal,  supple Respiratory: clear to auscultation bilaterally, no wheezing, no crackles.  Cardiovascular: Regular rate and rhythm, no murmurs / rubs / gallops. No extremity edema.  Abdomen: Quite tender to palpation especially in the lower quadrants, mild voluntary guarding, no rebound.  Diminished bowel sounds Musculoskeletal: no clubbing / cyanosis. Normal muscle tone.  Skin: no rashes, lesions, ulcers. No induration Neurologic: CN 2-12 grossly intact. Strength 5/5 in all 4.  Psychiatric: Normal judgment and insight. Alert and oriented x 3. Normal mood.   Labs on Admission: I have personally reviewed following labs and imaging studies  CBC: Recent Labs  Lab 01/01/19 2126  WBC 10.0  HGB 15.5*  HCT 45.7  MCV 87.0  PLT 229   Basic Metabolic Panel: Recent Labs  Lab 01/01/19 2126  NA 138  K 4.0  CL 106  CO2 23  GLUCOSE 115*  BUN 9  CREATININE 0.55  CALCIUM 9.1   Liver Function Tests: Recent Labs  Lab 01/01/19 2126  AST 19  ALT 19  ALKPHOS 43  BILITOT 1.2  PROT 7.1  ALBUMIN 4.0   Coagulation Profile: No results for input(s): INR, PROTIME in the last 168 hours. BNP (last 3 results) No results for input(s): PROBNP in the last 8760 hours. CBG: No results for input(s): GLUCAP in the last 168 hours. Thyroid Function Tests: No results for input(s): TSH, T4TOTAL, FREET4, T3FREE, THYROIDAB in the last 72 hours. Urine analysis:    Component Value Date/Time   COLORURINE YELLOW (A) 01/01/2019 2126   APPEARANCEUR HAZY (A) 01/01/2019 2126   APPEARANCEUR Cloudy 08/31/2011 2204   LABSPEC 1.019 01/01/2019 2126   LABSPEC 1.018 08/31/2011 2204   PHURINE 6.0 01/01/2019 2126   GLUCOSEU NEGATIVE 01/01/2019 2126   GLUCOSEU Negative 08/31/2011 2204   HGBUR NEGATIVE 01/01/2019 2126   BILIRUBINUR NEGATIVE 01/01/2019 2126   BILIRUBINUR Negative 08/31/2011 2204   KETONESUR NEGATIVE 01/01/2019 2126   PROTEINUR NEGATIVE 01/01/2019 2126   NITRITE NEGATIVE 01/01/2019 2126   LEUKOCYTESUR  TRACE (A) 01/01/2019 2126   LEUKOCYTESUR 1+ 08/31/2011 2204     Radiological Exams on Admission: CT ABDOMEN PELVIS W CONTRAST  Result Date: 01/02/2019 CLINICAL DATA:  Abdominal pain. EXAM: CT ABDOMEN AND PELVIS WITH CONTRAST TECHNIQUE: Multidetector CT imaging of the abdomen and pelvis was performed using the standard protocol following bolus administration of intravenous contrast. CONTRAST:  125mL OMNIPAQUE IOHEXOL 300 MG/ML  SOLN COMPARISON:  CT dated December 25, 2018 FINDINGS: Lower chest: The lung bases are clear. The heart size is normal. Hepatobiliary: There is decreased hepatic attenuation suggestive of hepatic steatosis. Normal gallbladder.There is no biliary ductal dilation. Pancreas: Normal contours without ductal dilatation. No peripancreatic fluid collection. Spleen: No splenic laceration or hematoma. Adrenals/Urinary Tract: --Adrenal glands: No adrenal hemorrhage. --Right kidney/ureter: No hydronephrosis or perinephric hematoma. --Left kidney/ureter: No hydronephrosis or perinephric hematoma. --Urinary bladder: Unremarkable. Stomach/Bowel: --Stomach/Duodenum: No hiatal hernia or other gastric abnormality. Normal duodenal course and caliber. --Small bowel: There are dilated loops of small bowel in the low midline abdomen measuring  up to approximately 4 cm. These loops of bowel are adjacent to the patient's prior umbilical hernia repair. There is no pneumatosis or free air. --Colon: No focal abnormality. --Appendix: Normal. Vascular/Lymphatic: Normal course and caliber of the major abdominal vessels. --No retroperitoneal lymphadenopathy. --No mesenteric lymphadenopathy. --No pelvic or inguinal lymphadenopathy. Reproductive: There is a 5.1 cm right ovarian cystic mass. Other: No ascites or free air. Again noted are findings of a possible recurrent infraumbilical hernia. There are phlegm a Gwyndolyn Kaufman changes about the prior umbilical hernia repair. There is a small pocket of fluid inferior to the mesh  measuring approximately 4 cm. This is stable from prior study. Musculoskeletal. No acute displaced fractures. IMPRESSION: 1. There are dilated loops of small bowel in the low midline abdomen adjacent to the patient's prior umbilical hernia repair. Findings likely represent a developing small bowel obstruction secondary to adhesions. 2. Persistent inflammatory changes about the patient's prior umbilical hernia repair with findings concerning for recurrent infraumbilical hernia as previously described. 3. There is a 5.1 cm right ovarian cystic mass. Further evaluation with an outpatient nonemergent ultrasound is recommended in 6-8 weeks. 4. Hepatic steatosis. Electronically Signed   By: Katherine Mantle M.D.   On: 01/02/2019 01:14    Assessment/Plan  Principal Problem Small bowel obstruction -Keep patient n.p.o., due to vomiting overnight and NG tube has been ordered however patient is unable to tolerate diet and will hold for now.  Provide supportive symptomatic treatment with IV fluids, antiemetics, pain control -General surgery to see  Active Problems Morbid obesity -Based on BMI of 65, patient would benefit from weight loss.  Patient  Recent UTI -Afebrile, no leukocytosis, no urinary symptoms currently, no need for further antibiotics as she completed at least 6 days already  DVT prophylaxis: Lovenox Code Status: Full code Family Communication: No family at bedside Disposition Plan: Home when ready Bed Type: MedSurg Consults called: General surgery Obs/Inp: Office  Pamella Pert, MD, PhD [facility]  Contact via [email or web address]  01/02/2019, 8:50 AM

## 2019-01-02 NOTE — ED Notes (Signed)
Pt awaiting admission

## 2019-01-02 NOTE — Consult Note (Signed)
SURGICAL CONSULTATION NOTE   HISTORY OF PRESENT ILLNESS (HPI):  48 y.o. female presented to [facility] ED for evaluation of abdominal pain. Patient reports intermittent abdominal pain since 2 weeks ago.  Last week she came to the ED CT scan was done showing inflammation around the umbilical area.  She went home and she was trying to eat small meals but yesterday she started experiencing pain 10 out of 10.  The pain does not radiate to other part of body.  There is no alleviating or aggravating factor.  Patient endorses some nausea but no vomiting.  She endorses that she has been doing bowel movement every day.  Cannot remember the last time she passed gas but she states it was yesterday.  Patient has history umbilical hernia repair 11 years ago.  Does not recall the surgeon.  Not following with the hernia until recently.  She has been gaining significant amount of weight.  Primary care refer her to nutritionist for this matter.  Patient has never been referred to bariatric clinic.  Surgery is consulted by Dr. Don Perking in this context for evaluation and management of small bowel obstruction.  PAST MEDICAL HISTORY (PMH):  Morbid obesity  PAST SURGICAL HISTORY (PSH):  Past Surgical History:  Procedure Laterality Date  . HERNIA REPAIR       MEDICATIONS:  Prior to Admission medications   Medication Sig Start Date End Date Taking? Authorizing Provider  cephALEXin (KEFLEX) 500 MG capsule Take 1 capsule (500 mg total) by mouth 3 (three) times daily for 10 days. 12/25/18 01/04/19 Yes Shaune Pollack, MD  GARLIC OIL PO Take by mouth.   Yes [provider]  HYDROcodone-acetaminophen (NORCO/VICODIN) 5-325 MG tablet Take 1-2 tablets by mouth every 6 (six) hours as needed for moderate pain or severe pain. 12/25/18 12/25/19 Yes Shaune Pollack, MD  Lactobacillus (PROBIOTIC ACIDOPHILUS PO) Take 1 capsule by mouth daily.   Yes [provider]  ondansetron (ZOFRAN ODT) 4 MG disintegrating tablet  Take 1 tablet (4 mg total) by mouth every 8 (eight) hours as needed for nausea or vomiting. 12/25/18  Yes Shaune Pollack, MD     ALLERGIES:  No Known Allergies   SOCIAL HISTORY:  Social History   Socioeconomic History  . Marital status: Married    Spouse name: Not on file  . Number of children: Not on file  . Years of education: Not on file  . Highest education level: Not on file  Occupational History  . Not on file  Tobacco Use  . Smoking status: Never Smoker  . Smokeless tobacco: Never Used  Substance and Sexual Activity  . Alcohol use: Not on file  . Drug use: Not on file  . Sexual activity: Not on file  Other Topics Concern  . Not on file  Social History Narrative  . Not on file   Social Determinants of Health   Financial Resource Strain:   . Difficulty of Paying Living Expenses: Not on file  Food Insecurity:   . Worried About Programme researcher, broadcasting/film/video in the Last Year: Not on file  . Ran Out of Food in the Last Year: Not on file  Transportation Needs:   . Lack of Transportation (Medical): Not on file  . Lack of Transportation (Non-Medical): Not on file  Physical Activity:   . Days of Exercise per Week: Not on file  . Minutes of Exercise per Session: Not on file  Stress:   . Feeling of Stress : Not on file  Social Connections:   . Frequency of Communication with Friends and Family: Not on file  . Frequency of Social Gatherings with Friends and Family: Not on file  . Attends Religious Services: Not on file  . Active Member of Clubs or Organizations: Not on file  . Attends BankerClub or Organization Meetings: Not on file  . Marital Status: Not on file  Intimate Partner Violence:   . Fear of Current or Ex-Partner: Not on file  . Emotionally Abused: Not on file  . Physically Abused: Not on file  . Sexually Abused: Not on file    The patient currently resides (home / rehab facility / nursing home): Home The patient normally is (ambulatory / bedbound): Ambulatory    FAMILY HISTORY:  Family History  Problem Relation Age of Onset  . Breast cancer Maternal Aunt      REVIEW OF SYSTEMS:  Constitutional: denies weight loss, fever, chills, or sweats  Eyes: denies any other vision changes, history of eye injury  ENT: denies sore throat, hearing problems  Respiratory: denies shortness of breath, wheezing  Cardiovascular: denies chest pain, palpitations  Gastrointestinal: Positive abdominal pain, positive nausea.  Denies diarrhea Genitourinary: denies burning with urination or urinary frequency Musculoskeletal: denies any other joint pains or cramps  Skin: denies any other rashes or skin discolorations  Neurological: denies any other headache, dizziness, weakness  Psychiatric: denies any other depression, anxiety   All other review of systems were negative   VITAL SIGNS:  Temp:  [97.6 F (36.4 C)-97.8 F (36.6 C)] 97.6 F (36.4 C) (12/10 0514) Pulse Rate:  [56-70] 56 (12/10 0514) Resp:  [20] 20 (12/10 0514) BP: (117-179)/(71-88) 117/71 (12/10 0514) SpO2:  [98 %-100 %] 100 % (12/10 0514) Weight:  [167.8 kg] 167.8 kg (12/09 2124)     Height: 5\' 3"  (160 cm) Weight: (!) 167.8 kg BMI (Calculated): 65.56   INTAKE/OUTPUT:  This shift: No intake/output data recorded.  Last 2 shifts: @IOLAST2SHIFTS @   PHYSICAL EXAM:  Constitutional:  --Morbidly obese -- Awake, alert, and oriented x3  Eyes:  -- Pupils equally round and reactive to light  -- No scleral icterus  Ear, nose, and throat:  -- No jugular venous distension  Pulmonary:  -- No crackles  -- Equal breath sounds bilaterally -- Breathing non-labored at rest Cardiovascular:  -- S1, S2 present  -- No pericardial rubs Gastrointestinal:  -- Abdomen soft, mild tender around periumbilical area, non-distended, no guarding or rebound tenderness.  Unable to palpate hernia defect due to body habitus -- No abdominal masses appreciated, pulsatile or otherwise  Musculoskeletal and Integumentary:   -- Wounds or skin discoloration: None appreciated -- Extremities: B/L UE and LE FROM, hands and feet warm, no edema  Neurologic:  -- Motor function: intact and symmetric -- Sensation: intact and symmetric   Labs:  CBC Latest Ref Rng & Units 01/01/2019 12/25/2018 08/31/2011  WBC 4.0 - 10.5 K/uL 10.0 9.0 7.4  Hemoglobin 12.0 - 15.0 g/dL 15.5(H) 14.8 14.2  Hematocrit 36.0 - 46.0 % 45.7 44.7 41.6  Platelets 150 - 400 K/uL 229 197 194   CMP Latest Ref Rng & Units 01/01/2019 12/25/2018 08/31/2011  Glucose 70 - 99 mg/dL 161(W115(H) 960(A122(H) 540(J105(H)  BUN 6 - 20 mg/dL 9 9 10   Creatinine 0.44 - 1.00 mg/dL 8.110.55 9.140.75 7.820.70  Sodium 135 - 145 mmol/L 138 137 142  Potassium 3.5 - 5.1 mmol/L 4.0 3.6 3.8  Chloride 98 - 111 mmol/L 106 104 110(H)  CO2 22 - 32 mmol/L  23 25 26   Calcium 8.9 - 10.3 mg/dL 9.1 8.9 8.4(L)  Total Protein 6.5 - 8.1 g/dL 7.1 6.6 6.2(L)  Total Bilirubin 0.3 - 1.2 mg/dL 1.2 2.0(H) 0.5  Alkaline Phos 38 - 126 U/L 43 44 48(L)  AST 15 - 41 U/L 19 19 17   ALT 0 - 44 U/L 19 18 15     Imaging studies:  EXAM: CT ABDOMEN AND PELVIS WITH CONTRAST  TECHNIQUE: Multidetector CT imaging of the abdomen and pelvis was performed using the standard protocol following bolus administration of intravenous contrast.  CONTRAST:  165mL OMNIPAQUE IOHEXOL 300 MG/ML  SOLN  COMPARISON:  CT dated December 25, 2018  FINDINGS: Lower chest: The lung bases are clear. The heart size is normal.  Hepatobiliary: There is decreased hepatic attenuation suggestive of hepatic steatosis. Normal gallbladder.There is no biliary ductal dilation.  Pancreas: Normal contours without ductal dilatation. No peripancreatic fluid collection.  Spleen: No splenic laceration or hematoma.  Adrenals/Urinary Tract:  --Adrenal glands: No adrenal hemorrhage.  --Right kidney/ureter: No hydronephrosis or perinephric hematoma.  --Left kidney/ureter: No hydronephrosis or perinephric hematoma.  --Urinary bladder:  Unremarkable.  Stomach/Bowel:  --Stomach/Duodenum: No hiatal hernia or other gastric abnormality. Normal duodenal course and caliber.  --Small bowel: There are dilated loops of small bowel in the low midline abdomen measuring up to approximately 4 cm. These loops of bowel are adjacent to the patient's prior umbilical hernia repair. There is no pneumatosis or free air.  --Colon: No focal abnormality.  --Appendix: Normal.  Vascular/Lymphatic: Normal course and caliber of the major abdominal vessels.  --No retroperitoneal lymphadenopathy.  --No mesenteric lymphadenopathy.  --No pelvic or inguinal lymphadenopathy.  Reproductive: There is a 5.1 cm right ovarian cystic mass.  Other: No ascites or free air. Again noted are findings of a possible recurrent infraumbilical hernia. There are phlegm a Elder Love changes about the prior umbilical hernia repair. There is a small pocket of fluid inferior to the mesh measuring approximately 4 cm. This is stable from prior study.  Musculoskeletal. No acute displaced fractures.  IMPRESSION: 1. There are dilated loops of small bowel in the low midline abdomen adjacent to the patient's prior umbilical hernia repair. Findings likely represent a developing small bowel obstruction secondary to adhesions. 2. Persistent inflammatory changes about the patient's prior umbilical hernia repair with findings concerning for recurrent infraumbilical hernia as previously described. 3. There is a 5.1 cm right ovarian cystic mass. Further evaluation with an outpatient nonemergent ultrasound is recommended in 6-8 weeks. 4. Hepatic steatosis.   Electronically Signed   By: Constance Holster M.D.   On: 01/02/2019 01:14  Assessment/Plan:  48 y.o. female with small bowel obstruction, complicated by pertinent comorbidities including morbid obesity, previous umbilical hernia repair.  Patient was oriented about the CT scan finding of small  bowel obstruction.  Currently most likely cause of obstruction is adhesion from previous surgery.  Bowel is seen in the hernia but does not seems to be the transition point.  There is also concern.  The main concern for this patient is her morbidly obesity and the difficulty managing this patient.  I will try to manage her bowel obstruction with conservative management with nasogastric tube that was placed this morning, IV fluids and replacing electrolytes.  We will do Gastrografin challenge for further assessment of bowel movement and possible resolution of the small bowel obstruction.  I had a discussion with the patient about her increased risk of surgery due to her morbid obesity.  I also discussed with  surgery my recommendation about being evaluated by bariatric clinic with a multidisciplinary team for weight loss.  Her status just seen in nutritionist and I think that is enough for her to get to and optimize weight.  I will follow her closely with physical exam and assistant for small bowel obstruction management.   Gae Gallop, MD

## 2019-01-03 ENCOUNTER — Inpatient Hospital Stay: Payer: Medicaid Other

## 2019-01-03 ENCOUNTER — Encounter: Payer: Self-pay | Admitting: Internal Medicine

## 2019-01-03 LAB — SARS CORONAVIRUS 2 (TAT 6-24 HRS): SARS Coronavirus 2: NEGATIVE

## 2019-01-03 MED ORDER — PROMETHAZINE HCL 25 MG/ML IJ SOLN
25.0000 mg | Freq: Once | INTRAMUSCULAR | Status: AC
  Administered 2019-01-03: 25 mg via INTRAVENOUS
  Filled 2019-01-03: qty 1

## 2019-01-03 MED ORDER — LORAZEPAM 2 MG/ML IJ SOLN
1.0000 mg | Freq: Once | INTRAMUSCULAR | Status: AC
  Administered 2019-01-03: 1 mg via INTRAVENOUS
  Filled 2019-01-03: qty 1

## 2019-01-03 NOTE — Progress Notes (Signed)
Initial Nutrition Assessment  DOCUMENTATION CODES:   Morbid obesity  INTERVENTION:   RD will monitor for diet advancement vs the need for nutrition support  NUTRITION DIAGNOSIS:   Inadequate oral intake related to acute illness as evidenced by NPO status.  GOAL:   Patient will meet greater than or equal to 90% of their needs  MONITOR:   Diet advancement, Labs, Weight trends, Skin, I & O's  REASON FOR ASSESSMENT:   Malnutrition Screening Tool    ASSESSMENT:   48 y.o. female with medical history significant of morbid obesity, history of periumbilical hernia repair who is admitted with SBO   Pt with poor appetite and oral intake for 1 day pta r/t nausea, vomiting and abdominal pain. Pt currently NPO. OGT in place. Per chart, pt with weight gain pta. RD will monitor for diet advancement vs the need for nutrition support.   Medications reviewed and include: lovenox, NaCl w/ 5% dextrose @75ml /hr  Labs reviewed:   NUTRITION - FOCUSED PHYSICAL EXAM:    Most Recent Value  Orbital Region  No depletion  Upper Arm Region  No depletion  Thoracic and Lumbar Region  No depletion  Buccal Region  No depletion  Temple Region  No depletion  Clavicle Bone Region  No depletion  Clavicle and Acromion Bone Region  No depletion  Scapular Bone Region  No depletion  Dorsal Hand  No depletion  Patellar Region  No depletion  Anterior Thigh Region  No depletion  Posterior Calf Region  No depletion  Edema (RD Assessment)  Mild  Hair  Reviewed  Eyes  Reviewed  Mouth  Reviewed  Skin  Reviewed  Nails  Reviewed     Diet Order:   Diet Order            Diet NPO time specified  Diet effective now             EDUCATION NEEDS:   No education needs have been identified at this time  Skin:  Skin Assessment: Reviewed RN Assessment  Last BM:  12/9  Height:   Ht Readings from Last 1 Encounters:  01/01/19 5\' 3"  (1.6 m)    Weight:   Wt Readings from Last 1 Encounters:   01/01/19 (!) 167.8 kg    Ideal Body Weight:  52.2 kg  BMI:  Body mass index is 65.54 kg/m.  Estimated Nutritional Needs:   Kcal:  2600-2900kcal/day  Protein:  >130g/day  Fluid:  1.8L/day  Koleen Distance MS, RD, LDN Pager #- [phone] Office#- [phone] After Hours Pager: [phone]

## 2019-01-03 NOTE — Progress Notes (Signed)
   01/03/19 1500  Clinical Encounter Type  Visited With Patient  Visit Type Initial  Referral From Nurse  Spiritual Encounters  Spiritual Needs Other (Comment) (Advance Directives)  Ch received an OR for AD education. Ch provided the education to the pt. Pt wishes to make her husband her 89. Ch notified the pt that due to visitor restrictions, completing the document is very limited at this time.

## 2019-01-03 NOTE — Progress Notes (Signed)
[facility] Day(s): 1.   Post op day(s):  Marland Kitchen   Interval History: Patient seen and examined, no acute events or new complaints overnight. Patient reports continue passing gas.  Report intermittent pain in her abdomen.  Pain radiates to her left lower quadrant.  Denies nausea or vomiting.   Vital signs in last 24 hours: [min-max] current  Temp:  [97.5 F (36.4 C)-98.2 F (36.8 C)] 97.9 F (36.6 C) (12/11 1229) Pulse Rate:  [62-71] 69 (12/11 1229) Resp:  [18] 18 (12/11 1229) BP: (116-133)/(65-77) 133/77 (12/11 1229) SpO2:  [95 %-97 %] 97 % (12/11 1229)     Height: 5\' 3"  (160 cm) Weight: (!) 167.8 kg BMI (Calculated): 65.56   NGT: 350 mL since placement  Physical Exam:  Constitutional: alert, cooperative and no distress  Respiratory: breathing non-labored at rest  Cardiovascular: regular rate and sinus rhythm  Gastrointestinal: soft, non-tender, and non-distended.  Morbidly obese  Labs:  CBC Latest Ref Rng & Units 01/01/2019 12/25/2018 08/31/2011  WBC 4.0 - 10.5 K/uL 10.0 9.0 7.4  Hemoglobin 12.0 - 15.0 g/dL 15.5(H) 14.8 14.2  Hematocrit 36.0 - 46.0 % 45.7 44.7 41.6  Platelets 150 - 400 K/uL 229 197 194   CMP Latest Ref Rng & Units 01/01/2019 12/25/2018 08/31/2011  Glucose 70 - 99 mg/dL 115(H) 122(H) 105(H)  BUN 6 - 20 mg/dL 9 9 10   Creatinine 0.44 - 1.00 mg/dL 0.55 0.75 0.70  Sodium 135 - 145 mmol/L 138 137 142  Potassium 3.5 - 5.1 mmol/L 4.0 3.6 3.8  Chloride 98 - 111 mmol/L 106 104 110(H)  CO2 22 - 32 mmol/L 23 25 26   Calcium 8.9 - 10.3 mg/dL 9.1 8.9 8.4(L)  Total Protein 6.5 - 8.1 g/dL 7.1 6.6 6.2(L)  Total Bilirubin 0.3 - 1.2 mg/dL 1.2 2.0(H) 0.5  Alkaline Phos 38 - 126 U/L 43 44 48(L)  AST 15 - 41 U/L 19 19 17   ALT 0 - 44 U/L 19 18 15     Imaging studies: I personally evaluated the images 8 hours after Gastrografin administration.  The Gastrografin has reached the transverse colon.   Assessment/Plan:  48 y.o. female with small bowel obstruction,  complicated by pertinent comorbidities including morbid obesity, previous umbilical hernia repair. Patient reports continue passing gas.  The new x-ray shows Gastrografin in the large intestine.  This is a good sign that the small bowel obstruction should resolve with conservative management.  I clamped the NGT and his patient continued to pass gas we will start clear liquid diet.  We will also follow with physical exam.  Agree with management by hospitalist.  Arnold Long, MD

## 2019-01-03 NOTE — Plan of Care (Signed)
  Problem: Education: Goal: Knowledge of General Education information will improve Description: Including pain rating scale, medication(s)/side effects and non-pharmacologic comfort measures Outcome: Progressing Pt understands her POC and has no further questions at the moment.  Will continue to offer support.     Problem: Clinical Measurements: Goal: Ability to maintain clinical measurements within normal limits will improve Outcome: Progressing VS and labs WNL.  Awaiting AM labs to further assess.    Problem: Clinical Measurements: Goal: Will remain free from infection Outcome: Progressing  S/Sx of infection monitor and assessed q-shift/ PRN, along with VS.  Pt has remained afebrile thus far.  Will continue to monitor and assess.    Problem: Clinical Measurements: Goal: Respiratory complications will improve Outcome: Progressing Respiratory status monitored and assessed q-shift/ PRN, along with VS.  Pt is on RA with O2 at 95% and respiration rate of 18 breaths per minute.  Pt has not expressed SOB or DOE.  Will continue to monitor and assess.    Problem: Clinical Measurements: Goal: Cardiovascular complication will be avoided Outcome: Progressing VS WNL and cardiovascular intact.  Will continue to monitor and assess.     Problem: Activity: Goal: Risk for activity intolerance will decrease Outcome: Progressing Pt is x1 assist to the BR.  She needs to the assistance of RN staff to reposition herself in the bed.  Will continue to monitor and assess.     Problem: Nutrition: Goal: Adequate nutrition will be maintained Outcome: Progressing Pt is currently NPO except sips/ chips per MD's orders.  Will continue to monitor and assess.     Problem: Coping: Goal: Level of anxiety will decrease Outcome: Progressing Pt has expressed anxiety r/t her hospitalization this shift.  Will continue to offer support and monitor.          Problem: Elimination: Goal: Will not experience  complications related to bowel motility Outcome: Progressing Pt is x1 assist to the BR.  Will continue to monitor and assess.    Problem: Pain Managment: Goal: General experience of comfort will improve Outcome: Progressing Pt has c/o 4/10 mid abdominal pain describing it as a constant ache.  Reiterated pain scale so she could adequately rate.  Discussed nonpharmacological methods to help reduce s/sx of pain.  Pt stated her goal this admission would be 0/10.  Interventions given per pt's request and MD's orders. Will continue to monitor and assess.   Problem: Safety: Goal: Ability to remain free from injury will improve Outcome: Progressing Instructed pt to utilize RN call light for assistance.  Hourly rounds performed.  Bed alarm implemented to keep pt safe from falls.  Settings set to third most sensitive mode. Bed in lowest position, locked with two upper/ one lowe side rails engaged.  Belongings and call light within reach.      Problem: Skin Integrity: Goal: Risk for impaired skin integrity will decrease Outcome: Progressing Skin integrity monitored and assessed q-shift/ PRN.  Instructed pt to turn q2 hours to prevent further skin impairment.  Tubes and drains assessed for device related pressure sores.  Dressing changes performed per MD's orders.  Will continue to monitor and assess.

## 2019-01-03 NOTE — Progress Notes (Addendum)
Progress Note    Shelia Cross  ZOX:[id number] DOB: 05/16/1970  DOA: 01/01/2019 PCP: [facility]       Assessment/Plan:   Active Problems:   SBO (small bowel obstruction) (HCC)   Body mass index is 65.54 kg/m.  (Morbid obesity)  Small bowel obstruction: Keep NPO.  IV fluids for hydration.  Analgesics as needed for pain.  Antiemetics as needed for nausea/vomiting.  Monitor kidney function and electrolytes.  Follow-up with surgeon for further recommendations.   Family Communication/Anticipated D/C date and plan/Code Status   DVT prophylaxis: Lovenox Code Status: Full code Family Communication: Plan discussed with the patient Disposition Plan: To be determined      Subjective:   C/o abdominal pain but it's better.  She also has discomfort from the NG tube.  Objective:    Vitals:   01/02/19 1413 01/02/19 2206 01/03/19 0530 01/03/19 1229  BP: (!) 146/74 116/65 117/73 133/77  Pulse: 63 62 71 69  Resp:  18 18 18   Temp: 97.7 F (36.5 C) 98.2 F (36.8 C) (!) 97.5 F (36.4 C) 97.9 F (36.6 C)  TempSrc: Oral Oral Oral Oral  SpO2: 100% 95% 96% 97%  Weight:      Height:        Intake/Output Summary (Last 24 hours) at 01/03/2019 1301 Last data filed at 01/03/2019 [id number] Gross per 24 hour  Intake 313.71 ml  Output 350 ml  Net -36.29 ml   Filed Weights   01/01/19 2124  Weight: (!) 167.8 kg    Exam:  GEN: NAD SKIN: No rash EYES: EOMI ENT: MMM, NG tube draining greenish fluid CV: RRR PULM: CTA B ABD: soft, obese, NT, +BS CNS: AAO x 3, non focal EXT: No edema or tenderness   Data Reviewed:   I have personally reviewed following labs and imaging studies:  Labs: Labs show the following:   Basic Metabolic Panel: Recent Labs  Lab 01/01/19 2126  NA 138  K 4.0  CL 106  CO2 23  GLUCOSE 115*  BUN 9  CREATININE 0.55  CALCIUM 9.1   GFR Estimated Creatinine Clearance: 131 mL/min (by C-G formula based on SCr of 0.55  mg/dL). Liver Function Tests: Recent Labs  Lab 01/01/19 2126  AST 19  ALT 19  ALKPHOS 43  BILITOT 1.2  PROT 7.1  ALBUMIN 4.0   Recent Labs  Lab 01/01/19 2126  LIPASE 29   No results for input(s): AMMONIA in the last 168 hours. Coagulation profile No results for input(s): INR, PROTIME in the last 168 hours.  CBC: Recent Labs  Lab 01/01/19 2126  WBC 10.0  HGB 15.5*  HCT 45.7  MCV 87.0  PLT 229   Cardiac Enzymes: No results for input(s): CKTOTAL, CKMB, CKMBINDEX, TROPONINI in the last 168 hours. BNP (last 3 results) No results for input(s): PROBNP in the last 8760 hours. CBG: No results for input(s): GLUCAP in the last 168 hours. D-Dimer: No results for input(s): DDIMER in the last 72 hours. Hgb A1c: No results for input(s): HGBA1C in the last 72 hours. Lipid Profile: No results for input(s): CHOL, HDL, LDLCALC, TRIG, CHOLHDL, LDLDIRECT in the last 72 hours. Thyroid function studies: No results for input(s): TSH, T4TOTAL, T3FREE, THYROIDAB in the last 72 hours.  Invalid input(s): FREET3 Anemia work up: No results for input(s): VITAMINB12, FOLATE, FERRITIN, TIBC, IRON, RETICCTPCT in the last 72 hours. Sepsis Labs: Recent Labs  Lab 01/01/19 2126  WBC 10.0    Microbiology  Recent Results (from the past 240 hour(s))  SARS CORONAVIRUS 2 (TAT 6-24 HRS) Nasopharyngeal Nasopharyngeal Swab     Status: None   Collection Time: 12/25/18 10:22 PM   Specimen: Nasopharyngeal Swab  Result Value Ref Range Status   SARS Coronavirus 2 NEGATIVE NEGATIVE Final    Comment: (NOTE) SARS-CoV-2 target nucleic acids are NOT DETECTED. The SARS-CoV-2 RNA is generally detectable in upper and lower respiratory specimens during the acute phase of infection. Negative results do not preclude SARS-CoV-2 infection, do not rule out co-infections with other pathogens, and should not be used as the sole basis for treatment or other patient management decisions. Negative results must be  combined with clinical observations, patient history, and epidemiological information. The expected result is Negative. Fact Sheet for Patients: SugarRoll.be Fact Sheet for Healthcare Providers: https://www.woods-mathews.com/ This test is not yet approved or cleared by the [place] FDA and  has been authorized for detection and/or diagnosis of SARS-CoV-2 by FDA under an Emergency Use Authorization (EUA). This EUA will remain  in effect (meaning this test can be used) for the duration of the COVID-19 declaration under Section 56 4(b)(1) of the Act, 21 U.S.C. section 360bbb-3(b)(1), unless the authorization is terminated or revoked sooner. Performed at [facility], [place] [address], [place], [place] [address]   SARS CORONAVIRUS 2 (TAT 6-24 HRS) Nasopharyngeal Nasopharyngeal Swab     Status: None   Collection Time: 01/02/19  2:37 PM   Specimen: Nasopharyngeal Swab  Result Value Ref Range Status   SARS Coronavirus 2 NEGATIVE NEGATIVE Final    Comment: (NOTE) SARS-CoV-2 target nucleic acids are NOT DETECTED. The SARS-CoV-2 RNA is generally detectable in upper and lower respiratory specimens during the acute phase of infection. Negative results do not preclude SARS-CoV-2 infection, do not rule out co-infections with other pathogens, and should not be used as the sole basis for treatment or other patient management decisions. Negative results must be combined with clinical observations, patient history, and epidemiological information. The expected result is Negative. Fact Sheet for Patients: SugarRoll.be Fact Sheet for Healthcare Providers: https://www.woods-mathews.com/ This test is not yet approved or cleared by the [place] FDA and  has been authorized for detection and/or diagnosis of SARS-CoV-2 by FDA under an Emergency Use Authorization (EUA). This EUA will remain  in effect (meaning  this test can be used) for the duration of the COVID-19 declaration under Section 56 4(b)(1) of the Act, 21 U.S.C. section 360bbb-3(b)(1), unless the authorization is terminated or revoked sooner. Performed at [facility], [place] [address] [place], [place] [address]     Procedures and diagnostic studies:  CT ABDOMEN PELVIS W CONTRAST  Result Date: 01/02/2019 CLINICAL DATA:  Abdominal pain. EXAM: CT ABDOMEN AND PELVIS WITH CONTRAST TECHNIQUE: Multidetector CT imaging of the abdomen and pelvis was performed using the standard protocol following bolus administration of intravenous contrast. CONTRAST:  142mL OMNIPAQUE IOHEXOL 300 MG/ML  SOLN COMPARISON:  CT dated December 25, 2018 FINDINGS: Lower chest: The lung bases are clear. The heart size is normal. Hepatobiliary: There is decreased hepatic attenuation suggestive of hepatic steatosis. Normal gallbladder.There is no biliary ductal dilation. Pancreas: Normal contours without ductal dilatation. No peripancreatic fluid collection. Spleen: No splenic laceration or hematoma. Adrenals/Urinary Tract: --Adrenal glands: No adrenal hemorrhage. --Right kidney/ureter: No hydronephrosis or perinephric hematoma. --Left kidney/ureter: No hydronephrosis or perinephric hematoma. --Urinary bladder: Unremarkable. Stomach/Bowel: --Stomach/Duodenum: No hiatal hernia or other gastric abnormality. Normal duodenal course and caliber. --Small bowel: There are dilated loops  of small bowel in the low midline abdomen measuring up to approximately 4 cm. These loops of bowel are adjacent to the patient's prior umbilical hernia repair. There is no pneumatosis or free air. --Colon: No focal abnormality. --Appendix: Normal. Vascular/Lymphatic: Normal course and caliber of the major abdominal vessels. --No retroperitoneal lymphadenopathy. --No mesenteric lymphadenopathy. --No pelvic or inguinal lymphadenopathy. Reproductive: There is a 5.1 cm right ovarian cystic mass. Other: No  ascites or free air. Again noted are findings of a possible recurrent infraumbilical hernia. There are phlegm a Gwyndolyn Kaufman changes about the prior umbilical hernia repair. There is a small pocket of fluid inferior to the mesh measuring approximately 4 cm. This is stable from prior study. Musculoskeletal. No acute displaced fractures. IMPRESSION: 1. There are dilated loops of small bowel in the low midline abdomen adjacent to the patient's prior umbilical hernia repair. Findings likely represent a developing small bowel obstruction secondary to adhesions. 2. Persistent inflammatory changes about the patient's prior umbilical hernia repair with findings concerning for recurrent infraumbilical hernia as previously described. 3. There is a 5.1 cm right ovarian cystic mass. Further evaluation with an outpatient nonemergent ultrasound is recommended in 6-8 weeks. 4. Hepatic steatosis. Electronically Signed   By: Katherine Mantle M.D.   On: 01/02/2019 01:14   DG Abd Portable 1V-Small Bowel Obstruction Protocol-initial, 8 hr delay  Result Date: 01/03/2019 CLINICAL DATA:  Small-bowel obstruction EXAM: PORTABLE ABDOMEN - 1 VIEW COMPARISON:  January 02, 2019 at 8:45 a.m. FINDINGS: Oral contrast is noted to the level of the colon. The enteric tube projects over the patient's stomach. There is no radiographic evidence for small bowel obstruction. IMPRESSION: 1. No radiographic evidence for small bowel obstruction. 2. Oral contrast is noted in the colon. Electronically Signed   By: Katherine Mantle M.D.   On: 01/03/2019 03:54   DG Abd Portable 1V  Result Date: 01/02/2019 CLINICAL DATA:  Status post NG tube placement. EXAM: PORTABLE ABDOMEN - 1 VIEW COMPARISON:  None. FINDINGS: NG tube is in good position with the tip and side-port in the stomach. IMPRESSION: As above. Electronically Signed   By: Drusilla Kanner M.D.   On: 01/02/2019 08:57    Medications:    enoxaparin (LOVENOX) injection  40 mg Subcutaneous BID     Continuous Infusions:  sodium chloride Stopped (01/02/19 0915)   dextrose 5% lactated ringers 75 mL/hr at 01/03/19 1104     LOS: 1 day   Kylian Loh  [facility]   *Please refer to [email or web address], password [email or web address] to get updated schedule on who will round on this patient, as hospitalists switch teams weekly. If 7PM-7AM, please contact night-coverage at [email or web address], password [email or web address] for any overnight needs.  01/03/2019, 1:01 PM

## 2019-01-04 LAB — BASIC METABOLIC PANEL
Anion gap: 8 (ref 5–15)
BUN: 9 mg/dL (ref 6–20)
CO2: 27 mmol/L (ref 22–32)
Calcium: 8.5 mg/dL — ABNORMAL LOW (ref 8.9–10.3)
Chloride: 106 mmol/L (ref 98–111)
Creatinine, Ser: 0.66 mg/dL (ref 0.44–1.00)
GFR calc Af Amer: >60 mL/min (ref >60)
GFR calc non Af Amer: >60 mL/min (ref >60)
Glucose, Bld: 114 mg/dL — ABNORMAL HIGH (ref 70–99)
Potassium: 3.9 mmol/L (ref 3.5–5.1)
Sodium: 141 mmol/L (ref 135–145)

## 2019-01-04 LAB — MAGNESIUM: Magnesium: 2.2 mg/dL (ref 1.7–2.4)

## 2019-01-04 NOTE — Progress Notes (Signed)
[place] Hospital Day(s): 2.   Post op day(s):  Marland Kitchen   Interval History: Patient seen and examined, no acute events or new complaints overnight. Patient reports having 2 bowel movement one last night and one this morning.  Denies nausea or vomiting.  Vital signs in last 24 hours: [min-max] current  Temp:  [97.5 F (36.4 C)-97.9 F (36.6 C)] 97.9 F (36.6 C) (12/12 0547) Pulse Rate:  [53-77] 53 (12/12 0547) Resp:  [18-20] 20 (12/12 0547) BP: (133-163)/(77-93) 148/90 (12/12 0547) SpO2:  [97 %-99 %] 99 % (12/12 0547)     Height: 5\' 3"  (160 cm) Weight: (!) 167.8 kg BMI (Calculated): 65.56   Physical Exam:  Constitutional: alert, cooperative and no distress  Respiratory: breathing non-labored at rest  Cardiovascular: regular rate and sinus rhythm  Gastrointestinal: soft, mild-tender, and non-distended  Labs:  CBC Latest Ref Rng & Units 01/01/2019 12/25/2018 08/31/2011  WBC 4.0 - 10.5 K/uL 10.0 9.0 7.4  Hemoglobin 12.0 - 15.0 g/dL 15.5(H) 14.8 14.2  Hematocrit 36.0 - 46.0 % 45.7 44.7 41.6  Platelets 150 - 400 K/uL 229 197 194   CMP Latest Ref Rng & Units 01/04/2019 01/01/2019 12/25/2018  Glucose 70 - 99 mg/dL 114(H) 115(H) 122(H)  BUN 6 - 20 mg/dL 9 9 9   Creatinine 0.44 - 1.00 mg/dL 0.66 0.55 0.75  Sodium 135 - 145 mmol/L 141 138 137  Potassium 3.5 - 5.1 mmol/L 3.9 4.0 3.6  Chloride 98 - 111 mmol/L 106 106 104  CO2 22 - 32 mmol/L 27 23 25   Calcium 8.9 - 10.3 mg/dL 8.5(L) 9.1 8.9  Total Protein 6.5 - 8.1 g/dL - 7.1 6.6  Total Bilirubin 0.3 - 1.2 mg/dL - 1.2 2.0(H)  Alkaline Phos 38 - 126 U/L - 43 44  AST 15 - 41 U/L - 19 19  ALT 0 - 44 U/L - 19 18    Imaging studies: No new pertinent imaging studies   Assessment/Plan:  48 y.o.femalewith small bowel obstruction, complicated by pertinent comorbidities includingmorbid obesity, previous umbilical hernia repair.  Patient with what it looks like is resolving small bowel obstruction with a 2 bowel movement since  yesterday.  Will clamp NGT and start clear liquid diet.  Will assess toleration.  If patient tolerates will be able to progress diet.  If patient does not tolerate will need to put NG tube back to suction again.  Encouraged the patient to ambulate.  Agree with DVT prophylaxis.  Arnold Long, MD

## 2019-01-04 NOTE — Progress Notes (Signed)
Progress Note    Shelia Cross  YWV:[id number] DOB: 1970/04/10  DOA: 01/01/2019 PCP: [facility] [place]       Assessment/Plan:   Active Problems:   SBO (small bowel obstruction) ([place])   Body mass index is 65.54 kg/m.  (Morbid obesity)  Small bowel obstruction: She has been started on clear liquid diet.  Discontinue IV fluids. Analgesics as needed for pain.  Antiemetics as needed for nausea/vomiting.  Monitor kidney function and electrolytes.  Follow-up with surgeon for further recommendations.   Family Communication/Anticipated D/C date and plan/Code Status   DVT prophylaxis: Lovenox Code Status: Full code Family Communication: Plan discussed with the patient Disposition Plan: To be determined      Subjective:   Abdominal pain is better.  She said she moved her bowels last night and this morning.  Been tolerating ice chips.  Objective:    Vitals:   01/03/19 1229 01/03/19 2035 01/04/19 0547 01/04/19 1320  BP: 133/77 (!) 163/93 (!) 148/90 (!) 141/83  Pulse: 69 77 (!) 53 70  Resp: 18 20 20 18   Temp: 97.9 F (36.6 C) (!) 97.5 F (36.4 C) 97.9 F (36.6 C) 98.1 F (36.7 C)  TempSrc: Oral Oral Oral   SpO2: 97% 98% 99% 100%  Weight:      Height:        Intake/Output Summary (Last 24 hours) at 01/04/2019 1857 Last data filed at 01/04/2019 1409 Gross per 24 hour  Intake 2692.77 ml  Output 500 ml  Net 2192.77 ml   Filed Weights   01/01/19 2124  Weight: (!) 167.8 kg    Exam:  GEN: NAD SKIN: No rash EYES: Anicteric ENT: NG tube in place CV: RRR PULM: CTA B ABD: soft, obese, NT, +BS CNS: AAO x 3, non focal EXT: No edema or tenderness    Data Reviewed:   I have personally reviewed following labs and imaging studies:  Labs: Labs show the following:   Basic Metabolic Panel: Recent Labs  Lab 01/01/19 2126 01/04/19 0345  NA 138 141  K 4.0 3.9  CL 106 106  CO2 23 27  GLUCOSE 115* 114*  BUN 9 9  CREATININE 0.55 0.66   CALCIUM 9.1 8.5*  MG  --  2.2   GFR Estimated Creatinine Clearance: 131 mL/min (by C-G formula based on SCr of 0.66 mg/dL). Liver Function Tests: Recent Labs  Lab 01/01/19 2126  AST 19  ALT 19  ALKPHOS 43  BILITOT 1.2  PROT 7.1  ALBUMIN 4.0   Recent Labs  Lab 01/01/19 2126  LIPASE 29   No results for input(s): AMMONIA in the last 168 hours. Coagulation profile No results for input(s): INR, PROTIME in the last 168 hours.  CBC: Recent Labs  Lab 01/01/19 2126  WBC 10.0  HGB 15.5*  HCT 45.7  MCV 87.0  PLT 229   Cardiac Enzymes: No results for input(s): CKTOTAL, CKMB, CKMBINDEX, TROPONINI in the last 168 hours. BNP (last 3 results) No results for input(s): PROBNP in the last 8760 hours. CBG: No results for input(s): GLUCAP in the last 168 hours. D-Dimer: No results for input(s): DDIMER in the last 72 hours. Hgb A1c: No results for input(s): HGBA1C in the last 72 hours. Lipid Profile: No results for input(s): CHOL, HDL, LDLCALC, TRIG, CHOLHDL, LDLDIRECT in the last 72 hours. Thyroid function studies: No results for input(s): TSH, T4TOTAL, T3FREE, THYROIDAB in the last 72 hours.  Invalid input(s): FREET3 Anemia work up: No results  for input(s): VITAMINB12, FOLATE, FERRITIN, TIBC, IRON, RETICCTPCT in the last 72 hours. Sepsis Labs: Recent Labs  Lab 01/01/19 2126  WBC 10.0    Microbiology Recent Results (from the past 240 hour(s))  SARS CORONAVIRUS 2 (TAT 6-24 HRS) Nasopharyngeal Nasopharyngeal Swab     Status: None   Collection Time: 12/25/18 10:22 PM   Specimen: Nasopharyngeal Swab  Result Value Ref Range Status   SARS Coronavirus 2 NEGATIVE NEGATIVE Final    Comment: (NOTE) SARS-CoV-2 target nucleic acids are NOT DETECTED. The SARS-CoV-2 RNA is generally detectable in upper and lower respiratory specimens during the acute phase of infection. Negative results do not preclude SARS-CoV-2 infection, do not rule out co-infections with other pathogens,  and should not be used as the sole basis for treatment or other patient management decisions. Negative results must be combined with clinical observations, patient history, and epidemiological information. The expected result is Negative. Fact Sheet for Patients: HairSlick.nohttps://www.fda.gov/media/138098/download Fact Sheet for Healthcare Providers: quierodirigir.comhttps://www.fda.gov/media/138095/download This test is not yet approved or cleared by the [place] FDA and  has been authorized for detection and/or diagnosis of SARS-CoV-2 by FDA under an Emergency Use Authorization (EUA). This EUA will remain  in effect (meaning this test can be used) for the duration of the COVID-19 declaration under Section 56 4(b)(1) of the Act, 21 U.S.C. section 360bbb-3(b)(1), unless the authorization is terminated or revoked sooner. Performed at [facility], [address] [place], [place] [address]   SARS CORONAVIRUS 2 (TAT 6-24 HRS) Nasopharyngeal Nasopharyngeal Swab     Status: None   Collection Time: 01/02/19  2:37 PM   Specimen: Nasopharyngeal Swab  Result Value Ref Range Status   SARS Coronavirus 2 NEGATIVE NEGATIVE Final    Comment: (NOTE) SARS-CoV-2 target nucleic acids are NOT DETECTED. The SARS-CoV-2 RNA is generally detectable in upper and lower respiratory specimens during the acute phase of infection. Negative results do not preclude SARS-CoV-2 infection, do not rule out co-infections with other pathogens, and should not be used as the sole basis for treatment or other patient management decisions. Negative results must be combined with clinical observations, patient history, and epidemiological information. The expected result is Negative. Fact Sheet for Patients: HairSlick.nohttps://www.fda.gov/media/138098/download Fact Sheet for Healthcare Providers: quierodirigir.comhttps://www.fda.gov/media/138095/download This test is not yet approved or cleared by the [place] FDA and  has been authorized for detection  and/or diagnosis of SARS-CoV-2 by FDA under an Emergency Use Authorization (EUA). This EUA will remain  in effect (meaning this test can be used) for the duration of the COVID-19 declaration under Section 56 4(b)(1) of the Act, 21 U.S.C. section 360bbb-3(b)(1), unless the authorization is terminated or revoked sooner. Performed at [facility], [address] [place], [place] [address]     Procedures and diagnostic studies:  DG Abd Portable 1V-Small Bowel Obstruction Protocol-initial, 8 hr delay  Result Date: 01/03/2019 CLINICAL DATA:  Small-bowel obstruction EXAM: PORTABLE ABDOMEN - 1 VIEW COMPARISON:  January 02, 2019 at 8:45 a.m. FINDINGS: Oral contrast is noted to the level of the colon. The enteric tube projects over the patient's stomach. There is no radiographic evidence for small bowel obstruction. IMPRESSION: 1. No radiographic evidence for small bowel obstruction. 2. Oral contrast is noted in the colon. Electronically Signed   By: Katherine Mantlehristopher  Green M.D.   On: 01/03/2019 03:54    Medications:   . enoxaparin (LOVENOX) injection  40 mg Subcutaneous BID   Continuous Infusions: . dextrose 5% lactated ringers 75 mL/hr at 01/04/19  0700     LOS: 2 days   Hydie Langan  [facility]   *Please refer to [email or web address], password [email or web address] to get updated schedule on who will round on this patient, as hospitalists switch teams weekly. If 7PM-7AM, please contact night-coverage at [email or web address], password [email or web address] for any overnight needs.  01/04/2019, 6:57 PM

## 2019-01-05 NOTE — Progress Notes (Signed)
[place] Hospital Day(s): 3.   Post op day(s):  Marland Kitchen   Interval History: Patient seen and examined, no acute events or new complaints overnight. Patient reports feeling better today.  She reports she had another bowel movement.  She reported continue passing gas.  She reported that the soreness of the abdominal wall has been decreasing.  Vital signs in last 24 hours: [min-max] current  Temp:  [97.6 F (36.4 C)-98.5 F (36.9 C)] 97.6 F (36.4 C) (12/13 0558) Pulse Rate:  [67-79] 79 (12/13 0558) Resp:  [18-20] 20 (12/13 0558) BP: (138-148)/(76-83) 138/83 (12/13 0558) SpO2:  [97 %-100 %] 97 % (12/13 0558)     Height: 5\' 3"  (160 cm) Weight: (!) 167.8 kg BMI (Calculated): 65.56   Physical Exam:  Constitutional: alert, cooperative and no distress  Respiratory: breathing non-labored at rest  Cardiovascular: regular rate and sinus rhythm  Gastrointestinal: soft, non-tender, and non-distended  Labs:  CBC Latest Ref Rng & Units 01/01/2019 12/25/2018 08/31/2011  WBC 4.0 - 10.5 K/uL 10.0 9.0 7.4  Hemoglobin 12.0 - 15.0 g/dL 15.5(H) 14.8 14.2  Hematocrit 36.0 - 46.0 % 45.7 44.7 41.6  Platelets 150 - 400 K/uL 229 197 194   CMP Latest Ref Rng & Units 01/04/2019 01/01/2019 12/25/2018  Glucose 70 - 99 mg/dL 114(H) 115(H) 122(H)  BUN 6 - 20 mg/dL 9 9 9   Creatinine 0.44 - 1.00 mg/dL 0.66 0.55 0.75  Sodium 135 - 145 mmol/L 141 138 137  Potassium 3.5 - 5.1 mmol/L 3.9 4.0 3.6  Chloride 98 - 111 mmol/L 106 106 104  CO2 22 - 32 mmol/L 27 23 25   Calcium 8.9 - 10.3 mg/dL 8.5(L) 9.1 8.9  Total Protein 6.5 - 8.1 g/dL - 7.1 6.6  Total Bilirubin 0.3 - 1.2 mg/dL - 1.2 2.0(H)  Alkaline Phos 38 - 126 U/L - 43 44  AST 15 - 41 U/L - 19 19  ALT 0 - 44 U/L - 19 18    Imaging studies: No new pertinent imaging studies   Assessment/Plan:  48 y.o.femalewith small bowel obstruction, complicated by pertinent comorbidities includingmorbid obesity, previous umbilical hernia repair.  Patient  with a resolving small bowel obstruction.  She tolerated clear liquids and continue passing stool.  I will advance her diet to full liquids.  I offered the patient to take out the NG but she prefers to keep the NG in case her nausea recurs.  If she tolerates full liquid diet patient can be advanced to soft diet and at that time the NG will need to be removed.  Again I had a discussion about bariatric clinic evaluation.  Patient report that she started looking for bariatric clinic options.  We will continue to follow for assistant of a small bowel obstruction.  Encouraged the patient to ambulate.  Arnold Long, MD

## 2019-01-05 NOTE — Progress Notes (Signed)
Progress Note    Shelia Cross  HFW:[id number] DOB: 07/02/1970  DOA: 01/01/2019 PCP: [facility]       Assessment/Plan:   Active Problems:   SBO (small bowel obstruction) (HCC)   Body mass index is 65.54 kg/m.  (Morbid obesity)  Small bowel obstruction: Diet has been advanced to full liquid diet. Analgesics as needed for pain.  Antiemetics as needed for nausea/vomiting. Follow-up with surgeon for further recommendations.  Right ovarian cyst (5.1 cm): Patient has been advised to follow-up with her PCP for repeat ultrasound in about 6 to 8 weeks for further evaluation of the cyst.   Family Communication/Anticipated D/C date and plan/Code Status   DVT prophylaxis: Lovenox Code Status: Full code Family Communication: Plan discussed with the patient Disposition Plan: Home tomorrow if she continues to improve.      Subjective:   She complains of nausea but overall she feels better.  No vomiting.  Abdominal pain is improved.  She is moving her bowels.  Objective:    Vitals:   01/04/19 1320 01/04/19 2100 01/05/19 0558 01/05/19 1229  BP: (!) 141/83 (!) 148/76 138/83 (!) 144/81  Pulse: 70 67 79 79  Resp: 18 20 20 20   Temp: 98.1 F (36.7 C) 98.5 F (36.9 C) 97.6 F (36.4 C) 98.6 F (37 C)  TempSrc:  Oral Oral Oral  SpO2: 100% 99% 97% 97%  Weight:      Height:        Intake/Output Summary (Last 24 hours) at 01/05/2019 1340 Last data filed at 01/05/2019 1038 Gross per 24 hour  Intake 1643.88 ml  Output 2 ml  Net 1641.88 ml   Filed Weights   01/01/19 2124  Weight: (!) 167.8 kg    Exam:  GEN: NAD SKIN: No rash EYES: Anicteric ENT: MMM, NG tube in place but this has been clamped. CV: RRR PULM: CTA B ABD: soft, obese, NT, +BS CNS: AAO x 3, non focal EXT: No edema or tenderness     Data Reviewed:   I have personally reviewed following labs and imaging studies:  Labs: Labs show the following:   Basic Metabolic  Panel: Recent Labs  Lab 01/01/19 2126 01/04/19 0345  NA 138 141  K 4.0 3.9  CL 106 106  CO2 23 27  GLUCOSE 115* 114*  BUN 9 9  CREATININE 0.55 0.66  CALCIUM 9.1 8.5*  MG  --  2.2   GFR Estimated Creatinine Clearance: 131 mL/min (by C-G formula based on SCr of 0.66 mg/dL). Liver Function Tests: Recent Labs  Lab 01/01/19 2126  AST 19  ALT 19  ALKPHOS 43  BILITOT 1.2  PROT 7.1  ALBUMIN 4.0   Recent Labs  Lab 01/01/19 2126  LIPASE 29   No results for input(s): AMMONIA in the last 168 hours. Coagulation profile No results for input(s): INR, PROTIME in the last 168 hours.  CBC: Recent Labs  Lab 01/01/19 2126  WBC 10.0  HGB 15.5*  HCT 45.7  MCV 87.0  PLT 229   Cardiac Enzymes: No results for input(s): CKTOTAL, CKMB, CKMBINDEX, TROPONINI in the last 168 hours. BNP (last 3 results) No results for input(s): PROBNP in the last 8760 hours. CBG: No results for input(s): GLUCAP in the last 168 hours. D-Dimer: No results for input(s): DDIMER in the last 72 hours. Hgb A1c: No results for input(s): HGBA1C in the last 72 hours. Lipid Profile: No results for input(s): CHOL, HDL, LDLCALC, TRIG, CHOLHDL, LDLDIRECT  in the last 72 hours. Thyroid function studies: No results for input(s): TSH, T4TOTAL, T3FREE, THYROIDAB in the last 72 hours.  Invalid input(s): FREET3 Anemia work up: No results for input(s): VITAMINB12, FOLATE, FERRITIN, TIBC, IRON, RETICCTPCT in the last 72 hours. Sepsis Labs: Recent Labs  Lab 01/01/19 2126  WBC 10.0    Microbiology Recent Results (from the past 240 hour(s))  SARS CORONAVIRUS 2 (TAT 6-24 HRS) Nasopharyngeal Nasopharyngeal Swab     Status: None   Collection Time: 01/02/19  2:37 PM   Specimen: Nasopharyngeal Swab  Result Value Ref Range Status   SARS Coronavirus 2 NEGATIVE NEGATIVE Final    Comment: (NOTE) SARS-CoV-2 target nucleic acids are NOT DETECTED. The SARS-CoV-2 RNA is generally detectable in upper and lower respiratory  specimens during the acute phase of infection. Negative results do not preclude SARS-CoV-2 infection, do not rule out co-infections with other pathogens, and should not be used as the sole basis for treatment or other patient management decisions. Negative results must be combined with clinical observations, patient history, and epidemiological information. The expected result is Negative. Fact Sheet for Patients: SugarRoll.be Fact Sheet for Healthcare Providers: https://www.woods-mathews.com/ This test is not yet approved or cleared by the [place] FDA and  has been authorized for detection and/or diagnosis of SARS-CoV-2 by FDA under an Emergency Use Authorization (EUA). This EUA will remain  in effect (meaning this test can be used) for the duration of the COVID-19 declaration under Section 56 4(b)(1) of the Act, 21 U.S.C. section 360bbb-3(b)(1), unless the authorization is terminated or revoked sooner. Performed at [facility], [place] [address] [place], [place] [address]     Procedures and diagnostic studies:  No results found.  Medications:   . enoxaparin (LOVENOX) injection  40 mg Subcutaneous BID   Continuous Infusions: . dextrose 5% lactated ringers Stopped (01/05/19 0557)     LOS: 3 days   Shelia Cross  [facility]   *Please refer to [email or web address], password [email or web address] to get updated schedule on who will round on this patient, as hospitalists switch teams weekly. If 7PM-7AM, please contact night-coverage at [email or web address], password [email or web address] for any overnight needs.  01/05/2019, 1:40 PM

## 2019-01-06 ENCOUNTER — Inpatient Hospital Stay: Payer: Medicaid Other

## 2019-01-06 DIAGNOSIS — N83209 Unspecified ovarian cyst, unspecified side: Secondary | ICD-10-CM | POA: Diagnosis present

## 2019-01-06 MED ORDER — ENSURE ENLIVE PO LIQD
237.0000 mL | Freq: Two times a day (BID) | ORAL | Status: DC
  Administered 2019-01-06: 237 mL via ORAL

## 2019-01-06 NOTE — Progress Notes (Addendum)
Progress Note    Shelia Cross  TKW:[id number] DOB: April 13, 1970  DOA: 01/01/2019 PCP: [facility]       Assessment/Plan:   Active Problems:   SBO (small bowel obstruction) (HCC)   Ovarian cyst, right   Body mass index is 61.94 kg/m.  (Morbid obesity)  Small bowel obstruction: Case was discussed with the surgeon at the bedside.  Repeat x-ray was done today and it shows normal bowel gas pattern.  Patient's diet will be advanced to a soft diet per surgeon's recommendation when she continues to improve, she may be discharged home tomorrow.  Antiemetics as needed for nausea. Follow-up with surgeon for further recommendations.  Right ovarian cyst (5.1 cm): Patient has been advised to follow-up with her PCP for repeat ultrasound in about 6 to 8 weeks for further evaluation of the cyst.   Family Communication/Anticipated D/C date and plan/Code Status   DVT prophylaxis: Lovenox Code Status: Full code Family Communication: Plan discussed with the patient Disposition Plan: Home tomorrow if she continues to improve.      Subjective:   She has been passing gas.  She is concerned about intermittent abdominal pain.  She also said she feels "queasy" at times.  She had a bowel movement yesterday.  No vomiting.   Objective:    Vitals:   01/05/19 1229 01/05/19 2020 01/06/19 0517 01/06/19 0654  BP: (!) 144/81 (!) 146/76 125/74   Pulse: 79 78 73   Resp: 20 20 20    Temp: 98.6 F (37 C) 98.6 F (37 C) 97.9 F (36.6 C)   TempSrc: Oral Oral Oral   SpO2: 97% 95% 95%   Weight:    (!) 158.6 kg  Height:        Intake/Output Summary (Last 24 hours) at 01/06/2019 1214 Last data filed at 01/06/2019 0930 Gross per 24 hour  Intake 360 ml  Output --  Net 360 ml   Filed Weights   01/01/19 2124 01/06/19 0654  Weight: (!) 167.8 kg (!) 158.6 kg    Exam:  GEN: NAD SKIN: No rash EYES: anicteric, NG tube in place (clamped) ENT: MMM CV: RRR PULM: CTA  B ABD: soft, obese, NT, +BS CNS: AAO x 3, non focal EXT: No edema or tenderness      Data Reviewed:   I have personally reviewed following labs and imaging studies:  Labs: Labs show the following:   Basic Metabolic Panel: Recent Labs  Lab 01/01/19 2126 01/04/19 0345  NA 138 141  K 4.0 3.9  CL 106 106  CO2 23 27  GLUCOSE 115* 114*  BUN 9 9  CREATININE 0.55 0.66  CALCIUM 9.1 8.5*  MG  --  2.2   GFR Estimated Creatinine Clearance: 126 mL/min (by C-G formula based on SCr of 0.66 mg/dL). Liver Function Tests: Recent Labs  Lab 01/01/19 2126  AST 19  ALT 19  ALKPHOS 43  BILITOT 1.2  PROT 7.1  ALBUMIN 4.0   Recent Labs  Lab 01/01/19 2126  LIPASE 29   No results for input(s): AMMONIA in the last 168 hours. Coagulation profile No results for input(s): INR, PROTIME in the last 168 hours.  CBC: Recent Labs  Lab 01/01/19 2126  WBC 10.0  HGB 15.5*  HCT 45.7  MCV 87.0  PLT 229   Cardiac Enzymes: No results for input(s): CKTOTAL, CKMB, CKMBINDEX, TROPONINI in the last 168 hours. BNP (last 3 results) No results for input(s): PROBNP in the last 8760  hours. CBG: No results for input(s): GLUCAP in the last 168 hours. D-Dimer: No results for input(s): DDIMER in the last 72 hours. Hgb A1c: No results for input(s): HGBA1C in the last 72 hours. Lipid Profile: No results for input(s): CHOL, HDL, LDLCALC, TRIG, CHOLHDL, LDLDIRECT in the last 72 hours. Thyroid function studies: No results for input(s): TSH, T4TOTAL, T3FREE, THYROIDAB in the last 72 hours.  Invalid input(s): FREET3 Anemia work up: No results for input(s): VITAMINB12, FOLATE, FERRITIN, TIBC, IRON, RETICCTPCT in the last 72 hours. Sepsis Labs: Recent Labs  Lab 01/01/19 2126  WBC 10.0    Microbiology Recent Results (from the past 240 hour(s))  SARS CORONAVIRUS 2 (TAT 6-24 HRS) Nasopharyngeal Nasopharyngeal Swab     Status: None   Collection Time: 01/02/19  2:37 PM   Specimen:  Nasopharyngeal Swab  Result Value Ref Range Status   SARS Coronavirus 2 NEGATIVE NEGATIVE Final    Comment: (NOTE) SARS-CoV-2 target nucleic acids are NOT DETECTED. The SARS-CoV-2 RNA is generally detectable in upper and lower respiratory specimens during the acute phase of infection. Negative results do not preclude SARS-CoV-2 infection, do not rule out co-infections with other pathogens, and should not be used as the sole basis for treatment or other patient management decisions. Negative results must be combined with clinical observations, patient history, and epidemiological information. The expected result is Negative. Fact Sheet for Patients: HairSlick.no Fact Sheet for Healthcare Providers: quierodirigir.com This test is not yet approved or cleared by the [place] FDA and  has been authorized for detection and/or diagnosis of SARS-CoV-2 by FDA under an Emergency Use Authorization (EUA). This EUA will remain  in effect (meaning this test can be used) for the duration of the COVID-19 declaration under Section 56 4(b)(1) of the Act, 21 U.S.C. section 360bbb-3(b)(1), unless the authorization is terminated or revoked sooner. Performed at [facility], [address], [place], [place] [address]     Procedures and diagnostic studies:  DG Abd 2 Views  Result Date: 01/06/2019 CLINICAL DATA:  48 year old female with recent abdominal pain. Query small-bowel obstruction. EXAM: ABDOMEN - 2 VIEW COMPARISON:  Radiographs 01/03/2019. CT Abdomen and Pelvis 01/02/2019. FINDINGS: Upright and supine views. Stable enteric tube with side hole the level of the gastric body. Bowel gas pattern remains within normal limits. Oral contrast has cleared since 01/03/2019 except in the rectum. Stable abdominal visceral contours. No acute osseous abnormality identified. IMPRESSION: 1. Normal bowel gas pattern. Near complete clearance of oral  contrast since 01/03/2019. 2. No free air.  Stable enteric tube. Electronically Signed   By: Odessa Fleming M.D.   On: 01/06/2019 09:54    Medications:   . enoxaparin (LOVENOX) injection  40 mg Subcutaneous BID  . feeding supplement (ENSURE ENLIVE)  237 mL Oral BID BM   Continuous Infusions:    LOS: 4 days   Brittnay Pigman  [facility]   *Please refer to [email or web address], password [email or web address] to get updated schedule on who will round on this patient, as hospitalists switch teams weekly. If 7PM-7AM, please contact night-coverage at [email or web address], password [email or web address] for any overnight needs.  01/06/2019, 12:14 PM

## 2019-01-06 NOTE — Progress Notes (Signed)
[facility] Day(s): 4.   Post op day(s):  Marland Kitchen   Interval History: Patient seen and examined, no acute events or new complaints overnight. Patient reports having intermittent mild pain in the lower abdomen.  Denies nausea or vomiting.  No pain radiation.  No alleviating or aggravating factors.  Vital signs in last 24 hours: [min-max] current  Temp:  [97.9 F (36.6 C)-98.6 F (37 C)] 97.9 F (36.6 C) (12/14 0517) Pulse Rate:  [73-79] 73 (12/14 0517) Resp:  [20] 20 (12/14 0517) BP: (125-146)/(74-81) 125/74 (12/14 0517) SpO2:  [95 %-97 %] 95 % (12/14 0517) Weight:  [158.6 kg] 158.6 kg (12/14 0654)     Height: 5\' 3"  (160 cm) Weight: (!) 158.6 kg BMI (Calculated): 61.95     Physical Exam:  Constitutional: alert, cooperative and no distress  Respiratory: breathing non-labored at rest  Cardiovascular: regular rate and sinus rhythm  Gastrointestinal: soft, non-tender, and non-distended  Labs:  CBC Latest Ref Rng & Units 01/01/2019 12/25/2018 08/31/2011  WBC 4.0 - 10.5 K/uL 10.0 9.0 7.4  Hemoglobin 12.0 - 15.0 g/dL 15.5(H) 14.8 14.2  Hematocrit 36.0 - 46.0 % 45.7 44.7 41.6  Platelets 150 - 400 K/uL 229 197 194   CMP Latest Ref Rng & Units 01/04/2019 01/01/2019 12/25/2018  Glucose 70 - 99 mg/dL 114(H) 115(H) 122(H)  BUN 6 - 20 mg/dL 9 9 9   Creatinine 0.44 - 1.00 mg/dL 0.66 0.55 0.75  Sodium 135 - 145 mmol/L 141 138 137  Potassium 3.5 - 5.1 mmol/L 3.9 4.0 3.6  Chloride 98 - 111 mmol/L 106 106 104  CO2 22 - 32 mmol/L 27 23 25   Calcium 8.9 - 10.3 mg/dL 8.5(L) 9.1 8.9  Total Protein 6.5 - 8.1 g/dL - 7.1 6.6  Total Bilirubin 0.3 - 1.2 mg/dL - 1.2 2.0(H)  Alkaline Phos 38 - 126 U/L - 43 44  AST 15 - 41 U/L - 19 19  ALT 0 - 44 U/L - 19 18    Imaging studies: No new pertinent imaging studies   Assessment/Plan:  48 y.o.femalewith small bowel obstruction, complicated by pertinent comorbidities includingmorbid obesity, previous umbilical hernia repair.  Patient  with mild recurrent intermittent abdominal pain.  Will perform x-ray for follow-up to see if small bowel continue within normal limits.  Physical exam difficult to perform due to body habitus.  No worsening sign on physical exam.  If x-ray is within normal limits will discontinue NGT and advance diet to soft diet.  Agree with DVT prophylaxis.  I encouraged the patient to ambulate.  Arnold Long, MD

## 2019-01-07 DIAGNOSIS — N83201 Unspecified ovarian cyst, right side: Secondary | ICD-10-CM

## 2019-01-07 NOTE — Discharge Summary (Signed)
Physician Discharge Summary  Shelia Cross [id number]:[id number] DOB: 12/17/1970 DOA: 01/01/2019  [facility]  Admit date: 01/01/2019 Discharge date: 01/07/2019  Discharge disposition: Home   Recommendations for Outpatient Follow-Up:    Follow-up with PCP for routine health maintenance.  Follow-up with bariatric surgeon for evaluation for bariatric surgery.   Discharge Diagnosis:   Active Problems:   SBO (small bowel obstruction) (HCC)   Ovarian cyst, right    Discharge Condition: Stable.  Diet recommendation: Heart healthy diet  Code status: Full code.    Hospital Course:   [name] is a 48 y.o. female with medical history significant of morbid obesity (BMI 61.9), history of periumbilical hernia repair, who presented to the hospital with abdominal pain, nausea and vomiting.  She was found to have small bowel obstruction.  She was seen in consultation by the general surgeon.  She was treated conservatively with NG tube drainage of gastric contents, IV fluids, analgesics and antiemetics.  Her symptoms slowly improved and she was able to move her bowels and tolerate a regular diet.  From general surgical standpoint, patient could be discharged home today.  Of note, a right ovarian cyst measuring 5.1 cm was seen on CT abdomin and pelvis .  She was advised to see her PCP for follow-up ultrasound in 6 to 8 weeks.     Discharge Exam:   Vitals:   01/06/19 2023 01/07/19 0448  BP: (!) 170/86 124/64  Pulse: 69 67  Resp: 20 20  Temp: 97.9 F (36.6 C) 98 F (36.7 C)  SpO2: 98% 96%   Vitals:   01/06/19 0654 01/06/19 1619 01/06/19 2023 01/07/19 0448  BP:  (!) 157/94 (!) 170/86 124/64  Pulse:  90 69 67  Resp:  16 20 20   Temp:  98.5 F (36.9 C) 97.9 F (36.6 C) 98 F (36.7 C)  TempSrc:   Oral Oral  SpO2:  96% 98% 96%  Weight: (!) 158.6 kg     Height:         GEN: NAD SKIN: No rash EYES: EOMI ENT: MMM CV: RRR PULM: CTA B ABD:  soft, obese, NT, +BS CNS: AAO x 3, non focal EXT: No edema or tenderness   The results of significant diagnostics from this hospitalization (including imaging, microbiology, ancillary and laboratory) are listed below for reference.     Procedures and Diagnostic Studies:   CT ABDOMEN PELVIS W CONTRAST  Result Date: 01/02/2019 CLINICAL DATA:  Abdominal pain. EXAM: CT ABDOMEN AND PELVIS WITH CONTRAST TECHNIQUE: Multidetector CT imaging of the abdomen and pelvis was performed using the standard protocol following bolus administration of intravenous contrast. CONTRAST:  125mL OMNIPAQUE IOHEXOL 300 MG/ML  SOLN COMPARISON:  CT dated December 25, 2018 FINDINGS: Lower chest: The lung bases are clear. The heart size is normal. Hepatobiliary: There is decreased hepatic attenuation suggestive of hepatic steatosis. Normal gallbladder.There is no biliary ductal dilation. Pancreas: Normal contours without ductal dilatation. No peripancreatic fluid collection. Spleen: No splenic laceration or hematoma. Adrenals/Urinary Tract: --Adrenal glands: No adrenal hemorrhage. --Right kidney/ureter: No hydronephrosis or perinephric hematoma. --Left kidney/ureter: No hydronephrosis or perinephric hematoma. --Urinary bladder: Unremarkable. Stomach/Bowel: --Stomach/Duodenum: No hiatal hernia or other gastric abnormality. Normal duodenal course and caliber. --Small bowel: There are dilated loops of small bowel in the low midline abdomen measuring up to approximately 4 cm. These loops of bowel are adjacent to the patient's prior umbilical hernia repair. There is no pneumatosis or free air. --Colon: No focal abnormality. --  Appendix: Normal. Vascular/Lymphatic: Normal course and caliber of the major abdominal vessels. --No retroperitoneal lymphadenopathy. --No mesenteric lymphadenopathy. --No pelvic or inguinal lymphadenopathy. Reproductive: There is a 5.1 cm right ovarian cystic mass. Other: No ascites or free air. Again noted are  findings of a possible recurrent infraumbilical hernia. There are phlegm a Gwyndolyn Kaufman changes about the prior umbilical hernia repair. There is a small pocket of fluid inferior to the mesh measuring approximately 4 cm. This is stable from prior study. Musculoskeletal. No acute displaced fractures. IMPRESSION: 1. There are dilated loops of small bowel in the low midline abdomen adjacent to the patient's prior umbilical hernia repair. Findings likely represent a developing small bowel obstruction secondary to adhesions. 2. Persistent inflammatory changes about the patient's prior umbilical hernia repair with findings concerning for recurrent infraumbilical hernia as previously described. 3. There is a 5.1 cm right ovarian cystic mass. Further evaluation with an outpatient nonemergent ultrasound is recommended in 6-8 weeks. 4. Hepatic steatosis. Electronically Signed   By: Katherine Mantle M.D.   On: 01/02/2019 01:14   DG Abd Portable 1V  Result Date: 01/02/2019 CLINICAL DATA:  Status post NG tube placement. EXAM: PORTABLE ABDOMEN - 1 VIEW COMPARISON:  None. FINDINGS: NG tube is in good position with the tip and side-port in the stomach. IMPRESSION: As above. Electronically Signed   By: Drusilla Kanner M.D.   On: 01/02/2019 08:57     Labs:   Basic Metabolic Panel: Recent Labs  Lab 01/01/19 2126 01/04/19 0345  NA 138 141  K 4.0 3.9  CL 106 106  CO2 23 27  GLUCOSE 115* 114*  BUN 9 9  CREATININE 0.55 0.66  CALCIUM 9.1 8.5*  MG  --  2.2   GFR Estimated Creatinine Clearance: 126 mL/min (by C-G formula based on SCr of 0.66 mg/dL). Liver Function Tests: Recent Labs  Lab 01/01/19 2126  AST 19  ALT 19  ALKPHOS 43  BILITOT 1.2  PROT 7.1  ALBUMIN 4.0   Recent Labs  Lab 01/01/19 2126  LIPASE 29   No results for input(s): AMMONIA in the last 168 hours. Coagulation profile No results for input(s): INR, PROTIME in the last 168 hours.  CBC: Recent Labs  Lab 01/01/19 2126  WBC 10.0    HGB 15.5*  HCT 45.7  MCV 87.0  PLT 229   Cardiac Enzymes: No results for input(s): CKTOTAL, CKMB, CKMBINDEX, TROPONINI in the last 168 hours. BNP: Invalid input(s): POCBNP CBG: No results for input(s): GLUCAP in the last 168 hours. D-Dimer No results for input(s): DDIMER in the last 72 hours. Hgb A1c No results for input(s): HGBA1C in the last 72 hours. Lipid Profile No results for input(s): CHOL, HDL, LDLCALC, TRIG, CHOLHDL, LDLDIRECT in the last 72 hours. Thyroid function studies No results for input(s): TSH, T4TOTAL, T3FREE, THYROIDAB in the last 72 hours.  Invalid input(s): FREET3 Anemia work up No results for input(s): VITAMINB12, FOLATE, FERRITIN, TIBC, IRON, RETICCTPCT in the last 72 hours. Microbiology Recent Results (from the past 240 hour(s))  SARS CORONAVIRUS 2 (TAT 6-24 HRS) Nasopharyngeal Nasopharyngeal Swab     Status: None   Collection Time: 01/02/19  2:37 PM   Specimen: Nasopharyngeal Swab  Result Value Ref Range Status   SARS Coronavirus 2 NEGATIVE NEGATIVE Final    Comment: (NOTE) SARS-CoV-2 target nucleic acids are NOT DETECTED. The SARS-CoV-2 RNA is generally detectable in upper and lower respiratory specimens during the acute phase of infection. Negative results do not preclude SARS-CoV-2 infection, do  not rule out co-infections with other pathogens, and should not be used as the sole basis for treatment or other patient management decisions. Negative results must be combined with clinical observations, patient history, and epidemiological information. The expected result is Negative. Fact Sheet for Patients: SugarRoll.be Fact Sheet for Healthcare Providers: https://www.woods-mathews.com/ This test is not yet approved or cleared by the [place] FDA and  has been authorized for detection and/or diagnosis of SARS-CoV-2 by FDA under an Emergency Use Authorization (EUA). This EUA will remain  in effect  (meaning this test can be used) for the duration of the COVID-19 declaration under Section 56 4(b)(1) of the Act, 21 U.S.C. section 360bbb-3(b)(1), unless the authorization is terminated or revoked sooner. Performed at [facility], [address] [place], [place] [address]      Discharge Instructions:   Discharge Instructions    Diet - low sodium heart healthy   Complete by: As directed    Low fat diet   Increase activity slowly   Complete by: As directed      Allergies as of 01/07/2019   No Known Allergies     Medication List    STOP taking these medications   cephALEXin 500 MG capsule Commonly known as: KEFLEX   GARLIC OIL PO   HYDROcodone-acetaminophen 5-325 MG tablet Commonly known as: NORCO/VICODIN   ondansetron 4 MG disintegrating tablet Commonly known as: Zofran ODT   PROBIOTIC ACIDOPHILUS PO         Time coordinating discharge: 25 minutes  Signed:  Naod Sweetland  [facility] 01/07/2019, 9:21 AM

## 2019-01-07 NOTE — Plan of Care (Signed)
Discharge order received. Patient mental status is at baseline. Vital signs stable . No signs of acute distress. Discharge instructions given. Patient verbalized understanding. No other issues noted at this time.   

## 2019-01-07 NOTE — Progress Notes (Signed)
[facility] Day(s): 5.   Post op day(s):  Marland Kitchen   Interval History: Patient seen and examined, no acute events or new complaints overnight. Patient reports having 2 bowel movement yesterday.  Patient reports continue passing gas.  She reports mild discomfort around the hernia area.  Vital signs in last 24 hours: [min-max] current  Temp:  [97.9 F (36.6 C)-98.5 F (36.9 C)] 98 F (36.7 C) (12/15 0448) Pulse Rate:  [67-90] 67 (12/15 0448) Resp:  [16-20] 20 (12/15 0448) BP: (124-170)/(64-94) 124/64 (12/15 0448) SpO2:  [96 %-98 %] 96 % (12/15 0448)     Height: 5\' 3"  (160 cm) Weight: (!) 158.6 kg BMI (Calculated): 61.95   Physical Exam:  Constitutional: alert, cooperative and no distress  Respiratory: breathing non-labored at rest  Cardiovascular: regular rate and sinus rhythm  Gastrointestinal: soft, non-tender, and non-distended  Labs:  CBC Latest Ref Rng & Units 01/01/2019 12/25/2018 08/31/2011  WBC 4.0 - 10.5 K/uL 10.0 9.0 7.4  Hemoglobin 12.0 - 15.0 g/dL 15.5(H) 14.8 14.2  Hematocrit 36.0 - 46.0 % 45.7 44.7 41.6  Platelets 150 - 400 K/uL 229 197 194   CMP Latest Ref Rng & Units 01/04/2019 01/01/2019 12/25/2018  Glucose 70 - 99 mg/dL 114(H) 115(H) 122(H)  BUN 6 - 20 mg/dL 9 9 9   Creatinine 0.44 - 1.00 mg/dL 0.66 0.55 0.75  Sodium 135 - 145 mmol/L 141 138 137  Potassium 3.5 - 5.1 mmol/L 3.9 4.0 3.6  Chloride 98 - 111 mmol/L 106 106 104  CO2 22 - 32 mmol/L 27 23 25   Calcium 8.9 - 10.3 mg/dL 8.5(L) 9.1 8.9  Total Protein 6.5 - 8.1 g/dL - 7.1 6.6  Total Bilirubin 0.3 - 1.2 mg/dL - 1.2 2.0(H)  Alkaline Phos 38 - 126 U/L - 43 44  AST 15 - 41 U/L - 19 19  ALT 0 - 44 U/L - 19 18    Imaging studies: I personally evaluated the images of the abdominal x-ray done yesterday.  There is no small bowel distention.  Adequate gas pattern.  No sign of obstruction.   Assessment/Plan:  48 y.o.femalewith small bowel obstruction, complicated by pertinent comorbidities  includingmorbid obesity, previous umbilical hernia repair. Patient with resolved small bowel obstruction.  She tolerates of diet.  She continue passing gas and bowel movement.  Had another long discussion with the patient about pursuing bariatric clinic evaluation.  Patient high risk for surgery at this moment.  When she lose weight she should be considered for recurrent umbilical hernia repair.  Patient already doing the context of the bariatric weight loss clinic.  She will be followed by primary care physician as well.  No need for urgent general surgery follow-up as outpatient at this moment.  Arnold Long, MD

## 2019-01-08 ENCOUNTER — Other Ambulatory Visit: Payer: Self-pay

## 2019-01-08 ENCOUNTER — Emergency Department
Admission: EM | Admit: 2019-01-08 | Discharge: 2019-01-09 | Disposition: A | Discharge status: Home / Self Care | Payer: Medicaid Other | Attending: Emergency Medicine | Admitting: Emergency Medicine

## 2019-01-08 ENCOUNTER — Emergency Department: Payer: Medicaid Other

## 2019-01-08 DIAGNOSIS — I82621 Acute embolism and thrombosis of deep veins of right upper extremity: Secondary | ICD-10-CM

## 2019-01-08 DIAGNOSIS — I82611 Acute embolism and thrombosis of superficial veins of right upper extremity: Secondary | ICD-10-CM | POA: Insufficient documentation

## 2019-01-08 DIAGNOSIS — M7989 Other specified soft tissue disorders: Secondary | ICD-10-CM

## 2019-01-08 DIAGNOSIS — M79601 Pain in right arm: Secondary | ICD-10-CM | POA: Diagnosis present

## 2019-01-08 HISTORY — DX: Compression of brain: G93.5

## 2019-01-08 HISTORY — DX: Unspecified intestinal obstruction, unspecified as to partial versus complete obstruction: K56.609

## 2019-01-08 MED ORDER — OXYCODONE HCL 5 MG PO TABS: 5.0000 mg | ORAL_TABLET | Freq: Three times a day (TID) | ORAL | 0 refills | Status: DC | PRN

## 2019-01-08 MED ORDER — OXYCODONE-ACETAMINOPHEN 5-325 MG PO TABS
1.0000 | ORAL_TABLET | Freq: Once | ORAL | Status: AC
  Administered 2019-01-08: 1 via ORAL
  Filled 2019-01-08: qty 1

## 2019-01-08 MED ORDER — CEPHALEXIN 500 MG PO CAPS: 500.0000 mg | ORAL_CAPSULE | Freq: Two times a day (BID) | ORAL | 0 refills | Status: DC

## 2019-01-08 NOTE — ED Provider Notes (Addendum)
[facility] Emergency Department Provider Note  ____________________________________________   First MD Initiated Contact with Patient 01/08/19 2145     (approximate)  I have reviewed the triage vital signs and the nursing notes.   HISTORY  Chief Complaint Arm Pain    HPI Shelia Cross is a 48 y.o. female who had recent admission for small bowel obstruction and treated conservatively comes in now with arm pain.  Patient states she had an IV for 6 days and then she developed a knot and inflammation at the IV site.  Patient states she is having moderate pain that is been going on for 1 day, nothing makes better, nothing makes it worse.  She states that she has noticed some redness around the area and there is also a knot as well.  She denies any shortness of breath or chest pain.          Past Medical History:  Diagnosis Date  . Hernia cerebri (HCC)   . Morbid obesity (HCC)   . Small bowel obstruction [facility])     Patient Active Problem List   Diagnosis Date Noted  . Ovarian cyst, right 01/06/2019  . SBO (small bowel obstruction) (HCC) 01/02/2019    Past Surgical History:  Procedure Laterality Date  . HERNIA REPAIR      Prior to Admission medications   Not on File    Allergies Patient has no known allergies.  Family History  Problem Relation Age of Onset  . Breast cancer Maternal Aunt     Social History Social History   Tobacco Use  . Smoking status: Never Smoker  . Smokeless tobacco: Never Used  Substance Use Topics  . Alcohol use: Not Currently    Comment: social  . Drug use: Never      Review of Systems Constitutional: No fever/chills Eyes: No visual changes. ENT: No sore throat. Cardiovascular: Denies chest pain. Respiratory: Denies shortness of breath. Gastrointestinal: No abdominal pain.  No nausea, no vomiting.  No diarrhea.  No constipation. Genitourinary: Negative for dysuria. Musculoskeletal: Negative for back  pain.  Right arm swelling and erythema. Skin: Negative for rash. Neurological: Negative for headaches, focal weakness or numbness. All other ROS negative ____________________________________________   PHYSICAL EXAM:  VITAL SIGNS: ED Triage Vitals  Enc Vitals Group     BP 01/08/19 2133 (!) 173/79     Pulse Rate 01/08/19 2133 84     Resp 01/08/19 2133 18     Temp 01/08/19 2133 97.8 F (36.6 C)     Temp Source 01/08/19 2133 Oral     SpO2 01/08/19 2133 99 %     Weight 01/08/19 2134 (!) 335 lb (152 kg)     Height 01/08/19 2134 5\' 4"  (1.626 m)     Head Circumference --      Peak Flow --      Pain Score 01/08/19 2134 4     Pain Loc --      Pain Edu? --      Excl. in GC? --     Constitutional: Alert and oriented. Well appearing and in no acute distress. Eyes: Conjunctivae are normal. EOMI. Head: Atraumatic. Nose: No congestion/rhinnorhea. Mouth/Throat: Mucous membranes are moist.   Neck: No stridor. Trachea Midline. FROM Cardiovascular: Normal rate, regular rhythm. Grossly normal heart sounds.  Good peripheral circulation. Respiratory: Normal respiratory effort.  No retractions. Lungs CTAB. Gastrointestinal: Soft and nontender. No distention. No abdominal bruits.  Musculoskeletal: No lower extremity tenderness nor edema.  No joint effusions.  Not felt on the antecubital region of her right arm.  There is also some redness as well as some tenderness during the IV site.  Good distal pulse.  Full range of motion of the joints. Neurologic:  Normal speech and language. No gross focal neurologic deficits are appreciated.  Skin:  Skin is warm, dry and intact. No rash noted. Psychiatric: Mood and affect are normal. Speech and behavior are normal. GU: Deferred   ____________________________________________   RADIOLOGY  ED MD interpretation: pending   Official radiology report(s): No results found.  ____________________________________________   PROCEDURES  Procedure(s)  performed (including Critical Care):  Procedures   ____________________________________________   INITIAL IMPRESSION / ASSESSMENT AND PLAN / ED COURSE  Daksha Koone was evaluated in Emergency Department on 01/08/2019 for the symptoms described in the history of present illness. She was evaluated in the context of the global COVID-19 pandemic, which necessitated consideration that the patient might be at risk for infection with the SARS-CoV-2 virus that causes COVID-19. Institutional protocols and algorithms that pertain to the evaluation of patients at risk for COVID-19 are in a state of rapid change based on information released by regulatory bodies including the CDC and federal and state organizations. These policies and algorithms were followed during the patient's care in the ED.    Most likely this is secondary to a superficial thrombophlebitis however given patient's recent hospitalization and his swelling will get DVT ultrasound.  Lower suspicion for abscess but will also image that.  Given there is a little bit of redness on top of that there could be a forming cellulitis ?  Patient handed off to oncoming team pending the ultrasound.  Will recommend ibuprofen, few pills of oxycodone given her prescription after her surgery was only for 15, and keflex if DVT study is negative but if positive pt will need anticoagulation.    ____________________________________________   FINAL CLINICAL IMPRESSION(S) / ED DIAGNOSES   Final diagnoses:  None      MEDICATIONS GIVEN DURING THIS VISIT:  Medications  oxyCODONE-acetaminophen (PERCOCET/ROXICET) 5-325 MG per tablet 1 tablet (1 tablet Oral Given 01/08/19 2209)     ED Discharge Orders         Ordered    oxyCODONE (ROXICODONE) 5 MG immediate release tablet  Every 8 hours PRN     01/08/19 2302    cephALEXin (KEFLEX) 500 MG capsule  2 times daily     01/08/19 2302           Note:  This document was prepared using Dragon voice  recognition software and may include unintentional dictation errors.   Vanessa Sterling, MD 01/08/19 [id number]    Vanessa Glenolden, MD 01/09/19 [phone]

## 2019-01-08 NOTE — Discharge Instructions (Addendum)
Start Eliquis 5mg  twice daily for 7 days, then once daily. Avoid Ibuprofen and aspirin products while taking the blood thinner. Return to the ER for worsening symptoms, active bleeding issues, difficulty breathing or other concerns.

## 2019-01-08 NOTE — ED Triage Notes (Signed)
Pt to the er for a golf ball size knot with inflammation present at an IV site that was d/c yesterday. IV was present for 6 days. Pt has pain to the site with light palpation.

## 2019-01-08 NOTE — ED Notes (Signed)
EDP at bedside  

## 2019-01-09 MED ORDER — APIXABAN 5 MG PO TABS: ORAL_TABLET | ORAL | 0 refills | Status: DC

## 2019-01-09 MED ORDER — APIXABAN 5 MG PO TABS
5.0000 mg | ORAL_TABLET | Freq: Once | ORAL | Status: AC
  Administered 2019-01-09: 5 mg via ORAL
  Filled 2019-01-09: qty 1

## 2019-01-09 NOTE — ED Provider Notes (Signed)
-----------------------------------------   12:27 AM on 01/09/2019 -----------------------------------------  Ultrasound interpreted per Dr. Nyoka Cowden:  Study is positive for an acute appearing occlusive DVT involving the  right cephalic vein.    Discussed with vascular surgery who recommends starting patient on Eliquis: 10 mg x 1 week tapering down to 5 mg daily.  Will see patient in the office in 1 week for follow-up and repeat ultrasound.  Have updated patient who is agreeable with plan of care.  Arm is mildly red and swollen.  It is not tense.  Advised her to avoid NSAIDs while taking Eliquis.  Strict return precautions given. Patient verbalizes understanding agrees with plan of care.   Paulette Blanch, MD 01/09/19 [phone]

## 2019-01-14 ENCOUNTER — Encounter (INDEPENDENT_AMBULATORY_CARE_PROVIDER_SITE_OTHER): Payer: Self-pay | Admitting: Vascular Surgery

## 2019-01-14 ENCOUNTER — Ambulatory Visit (INDEPENDENT_AMBULATORY_CARE_PROVIDER_SITE_OTHER): Payer: Medicaid Other | Admitting: Vascular Surgery

## 2019-01-14 ENCOUNTER — Other Ambulatory Visit: Payer: Self-pay

## 2019-01-14 VITALS — BP 121/80 | HR 68 | Resp 16 | Ht 63.0 in | Wt 349.0 lb

## 2019-01-14 DIAGNOSIS — K56609 Unspecified intestinal obstruction, unspecified as to partial versus complete obstruction: Secondary | ICD-10-CM

## 2019-01-14 DIAGNOSIS — I809 Phlebitis and thrombophlebitis of unspecified site: Secondary | ICD-10-CM | POA: Insufficient documentation

## 2019-01-14 DIAGNOSIS — I808 Phlebitis and thrombophlebitis of other sites: Secondary | ICD-10-CM | POA: Diagnosis not present

## 2019-01-14 NOTE — Assessment & Plan Note (Signed)
Was the reason for her recent hospitalization.  Seems to be improving from this

## 2019-01-14 NOTE — Patient Instructions (Signed)
Thrombophlebitis Thrombophlebitis is a condition in which a blood clot forms in a vein. This can happen in your arms or legs, or in the area between your neck and groin (torso). When this condition happens in a vein that is close to the surface of the body (superficial thrombophlebitis), it is usually not serious.However, when the condition happens in a vein that is deep inside the body (deep vein thrombosis, DVT), it can cause serious problems. What are the causes? This condition may be caused by:  Damage to a vein.  Inflammation of the veins.  A condition that causes blood to clot more easily.  Reduced blood flow through the veins. What increases the risk? The following factors may make you more likely to develop this condition:  Having a condition that makes blood thicker or more likely to clot.  Having an infection.  Having major surgery.  Experiencing a traumatic injury or a broken bone.  Having a catheter in a vein (central line).  Having a condition in which valves in the veins do not work properly, causing blood to collect (pool) in the veins (chronic venous insufficiency).  An inactive (sedentary) lifestyle.  Pregnancy or having recently given birth.  Cancer.  Older age, especially being 60 or older.  Obesity.  Smoking.  Taking medicines that contain estrogen, such as birth control pills.  Having varicose veins.  Using drugs that are injected into the veins (intravenous, IV). What are the signs or symptoms? The main symptoms of this condition are:  Swelling and pain in an arm or leg. If the affected vein is in the leg, you may feel pain while standing or walking.  Warmth or redness in an arm or leg. Other symptoms include:  Low-grade fever.  Muscle aches.  A bulging vein (venous distension). In some cases, there are no symptoms. How is this diagnosed? This condition may be diagnosed based on:  Your symptoms and medical history.  A physical exam.   Tests, such as: ? Blood tests. ? A test that uses sound waves to make images (ultrasound). How is this treated? Treatment depends on how severe the condition is and which area of the body is affected. Treatment may include:  Applying a warm compress or heating pad to affected areas.  Wearing compression stockings to help prevent blood clots and reduce swelling in your legs.  Raising (elevating) the affected arm or leg above the level of your heart.  Medicines, such as: ? Anti-inflammatory medicines, such as ibuprofen. ? Blood thinners (anticoagulants), such as heparin. ? Antibiotic medicine, if you have an infection.  Removing an IV that may be causing the problem. In rare cases, surgery may be needed to:  Remove a damaged section of a vein.  Place a filter in a large vein to catch blood clots before they reach the lungs. Follow these instructions at home: Medicines  Take over-the-counter and prescription medicines only as told by your health care provider.  If you were prescribed an antibiotic, take it as told by your health care provider. Do not stop using the antibiotic even if you feel better. Managing pain, stiffness, and swelling   If directed, put heat on the affected area as often as told by your health care provider. Use the heat source that your health care provider recommends, such as a moist heat pack or a heating pad. ? Place a towel between your skin and the heat source. ? Leave the heat on for 20-30 minutes. ? Remove the heat   if your skin turns bright red. This is especially important if you are not able to feel pain, heat, or cold. You may have a greater risk of getting burned.  Elevate the affected area above the level of your heart while you are sitting or lying down. Activity  Return to your normal activities as told by your health care provider. Ask your health care provider what activities are safe for you.  Avoid sitting or lying down for long  periods. If possible, stand up and walk around regularly. If you are taking blood thinners:  Take your medicine exactly as told, at the same time every day.  Avoid activities that could cause injury or bruising, and follow instructions about how to prevent falls.  Wear a medical alert bracelet or carry a card that lists what medicines you take. General instructions  Drink enough fluid to keep your urine pale yellow.  Wear compression stockings as told by your health care provider.  Do not use any products that contain nicotine or tobacco, such as cigarettes and e-cigarettes. If you need help quitting, ask your health care provider.  Keep all follow-up visits as told by your health care provider. This is important. Contact a health care provider if:  You miss a dose of your blood thinner, if applicable.  Your symptoms do not improve.  You have unusual bruising.  You have nausea, vomiting, or diarrhea that lasts for more than one day. Get help right away if:  You have any of these problems: ? New or worse pain, swelling, or redness in an arm or leg. ? Numbness or tingling in an arm or leg. ? Shortness of breath. ? Chest pain. ? Severe pain in your abdomen. ? Fast breathing. ? A fast or irregular heartbeat. ? Blood in your vomit, stool, or urine. ? A severe headache or confusion. ? A cut that does not stop bleeding.  You feel light-headed or dizzy.  You cough up blood.  You have a serious fall or accident, or you hit your head. These symptoms may represent a serious problem that is an emergency. Do not wait to see if the symptoms will go away. Get medical help right away. Call your local emergency services (911 in the [place] Do not drive yourself to the hospital. Summary  Thrombophlebitis is a condition in which a blood clot forms in a vein. This can happen in a vein close to the surface of the body or a vein deep inside the body.  This condition can cause serious  problems when it happens in a vein deep inside the body (deep vein thrombosis, DVT).  The main symptom of this condition is swelling and pain around the affected vein.  Treatment may include warm compresses, anti-inflammatory medicines, or blood thinners. This information is not intended to replace advice given to you by your health care provider. Make sure you discuss any questions you have with your health care provider. Document Released: 07/05/2016 Document Revised: 05/01/2018 Document Reviewed: 07/05/2016 Elsevier Patient Education  2020 [employer]  

## 2019-01-14 NOTE — Progress Notes (Signed)
ADDENDUM TO 01/02/2019 4:04 PM FOLLOW UP SURGERY NOTE  This encounter was more than 30 minutes most of the time counseling the patient and coordinating plan of care.   Herbert Pun, MD

## 2019-01-14 NOTE — Assessment & Plan Note (Signed)
An ultrasound was performed at the hospital that was read out as an acute DVT of the right cephalic vein.  This would be an incorrect report given the fact that the cephalic vein is a superficial vein and not a deep vein.  Reading through the report, there does not appear to be a true thrombosis in the deep vein of the arm.   At this point, we discussed that this is a superficial thrombophlebitis and not a deep venous thrombosis so she wants to come off of anticoagulation is certainly reasonable.  She does.  She will start 325 mg aspirin and take that daily.  I would do aspirin therapy for a minimum of 6 months and potentially switch her over to a baby aspirin at some point at that time.  I discussed the extremely small risk of pulmonary embolus and the small risk of propagation.  I will plan to repeat an ultrasound in about a month to assess for propagation or worsening issues.  Continue elevation, warm compresses, and aspirin therapy.

## 2019-01-14 NOTE — Progress Notes (Signed)
Patient ID: Shelia Cross, female   DOB: 05-20-1970, 48 y.o.   MRN: [id number]  Chief Complaint  Patient presents with  . Follow-up    1 armc    HPI Shelia Cross is a 48 y.o. female.  I am asked to see the patient by Dr. Fuller Plan in the [facility] ER for evaluation of cephalic vein thrombosis after recent hospitalization.  The hospitalization was for a bowel obstruction where she is still in recovery from this.  The area became red, hot, and swollen.  This was the site of a previous IV.  This is in the right arm.  No chest pain or shortness of breath.  No fevers or chills.  An ultrasound was performed at the hospital that was read out as an acute DVT of the right cephalic vein.  This would be an incorrect report given the fact that the cephalic vein is a superficial vein and not a deep vein.  Reading through the report, there does not appear to be a true thrombosis in the deep vein of the arm.   She was started on Eliquis despite the fact this was a superficial vein secondary to this reading.  Over the past week, the redness and swelling in the area have decreased.  She has had some easy bruising although no overt bleeding from being on anticoagulation.  No chest pain or shortness of breath since she was in the hospital.  A firm cord remains in the right upper arm.  Past Medical History:  Diagnosis Date  . Hernia cerebri (HCC)   . Morbid obesity (HCC)   . Small bowel obstruction [facility])     Past Surgical History:  Procedure Laterality Date  . HERNIA REPAIR       Family History  Problem Relation Age of Onset  . Breast cancer Maternal Aunt   No bleeding disorders, clotting disorders, or autoimmune diseases    Social History   Tobacco Use  . Smoking status: Never Smoker  . Smokeless tobacco: Never Used  Substance Use Topics  . Alcohol use: Not Currently    Comment: social  . Drug use: Never     No Known Allergies  Current Outpatient Medications  Medication Sig Dispense Refill  .  apixaban (ELIQUIS) 5 MG TABS tablet Take 2 tablets (10mg ) twice daily for 7 days, then 1 tablet (5mg ) twice daily 60 tablet 0   No current facility-administered medications for this visit.      REVIEW OF SYSTEMS (Negative unless checked)  Constitutional: [] Weight loss  [] Fever  [] Chills Cardiac: [] Chest pain   [] Chest pressure   [] Palpitations   [] Shortness of breath when laying flat   [] Shortness of breath at rest   [] Shortness of breath with exertion. Vascular:  [] Pain in legs with walking   [] Pain in legs at rest   [] Pain in legs when laying flat   [] Claudication   [] Pain in feet when walking  [] Pain in feet at rest  [] Pain in feet when laying flat   [] History of DVT   [x] Phlebitis   [] Swelling in legs   [] Varicose veins   [] Non-healing ulcers Pulmonary:   [] Uses home oxygen   [] Productive cough   [] Hemoptysis   [] Wheeze  [] COPD   [] Asthma Neurologic:  [] Dizziness  [] Blackouts   [] Seizures   [] History of stroke   [] History of TIA  [] Aphasia   [] Temporary blindness   [] Dysphagia   [] Weakness or numbness in arms   [] Weakness or numbness in legs Musculoskeletal:  []   Arthritis   [] Joint swelling   [] Joint pain   [] Low back pain Hematologic:  [] Easy bruising  [] Easy bleeding   [] Hypercoagulable state   [] Anemic  [] Hepatitis Gastrointestinal:  [] Blood in stool   [] Vomiting blood  [] Gastroesophageal reflux/heartburn   [x] Abdominal pain Genitourinary:  [] Chronic kidney disease   [] Difficult urination  [] Frequent urination  [] Burning with urination   [] Hematuria Skin:  [] Rashes   [] Ulcers   [] Wounds Psychological:  [] History of anxiety   []  History of major depression.    Physical Exam BP 121/80 (BP Location: Left Arm)   Pulse 68   Resp 16   Ht 5\' 3"  (1.6 m)   Wt (!) 349 lb (158.3 kg)   LMP 12/25/2018 (Exact Date)   BMI 61.82 kg/m  Gen:  WD/WN, NAD Head: [facility]/AT, No temporalis wasting.  Ear/Nose/Throat: Hearing grossly intact, nares w/o erythema or drainage, oropharynx w/o  Erythema/Exudate Eyes: Conjunctiva clear, sclera non-icteric  Neck: trachea midline.  No JVD.  Pulmonary:  Good air movement, respirations not labored, no use of accessory muscles  Cardiac: RRR, no JVD Vascular:  Vessel Right Left  Radial Palpable Palpable                                   Gastrointestinal:. No masses, surgical incisions, or scars. Musculoskeletal: M/S 5/5 throughout.  Extremities without ischemic changes.  No deformity or atrophy.  A firm cord is present overlying the superficial thrombophlebitis in the right cephalic vein at and just above the antecubital fossa.  Mild surrounding erythema and edema. Neurologic: Sensation grossly intact in extremities.  Symmetrical.  Speech is fluent. Motor exam as listed above. Psychiatric: Judgment intact, Mood & affect appropriate for pt's clinical situation. Dermatologic: No rashes or ulcers noted.  No cellulitis or open wounds.    Radiology CT ABDOMEN PELVIS W CONTRAST  Result Date: 01/02/2019 CLINICAL DATA:  Abdominal pain. EXAM: CT ABDOMEN AND PELVIS WITH CONTRAST TECHNIQUE: Multidetector CT imaging of the abdomen and pelvis was performed using the standard protocol following bolus administration of intravenous contrast. CONTRAST:  135mL OMNIPAQUE IOHEXOL 300 MG/ML  SOLN COMPARISON:  CT dated December 25, 2018 FINDINGS: Lower chest: The lung bases are clear. The heart size is normal. Hepatobiliary: There is decreased hepatic attenuation suggestive of hepatic steatosis. Normal gallbladder.There is no biliary ductal dilation. Pancreas: Normal contours without ductal dilatation. No peripancreatic fluid collection. Spleen: No splenic laceration or hematoma. Adrenals/Urinary Tract: --Adrenal glands: No adrenal hemorrhage. --Right kidney/ureter: No hydronephrosis or perinephric hematoma. --Left kidney/ureter: No hydronephrosis or perinephric hematoma. --Urinary bladder: Unremarkable. Stomach/Bowel: --Stomach/Duodenum: No hiatal hernia  or other gastric abnormality. Normal duodenal course and caliber. --Small bowel: There are dilated loops of small bowel in the low midline abdomen measuring up to approximately 4 cm. These loops of bowel are adjacent to the patient's prior umbilical hernia repair. There is no pneumatosis or free air. --Colon: No focal abnormality. --Appendix: Normal. Vascular/Lymphatic: Normal course and caliber of the major abdominal vessels. --No retroperitoneal lymphadenopathy. --No mesenteric lymphadenopathy. --No pelvic or inguinal lymphadenopathy. Reproductive: There is a 5.1 cm right ovarian cystic mass. Other: No ascites or free air. Again noted are findings of a possible recurrent infraumbilical hernia. There are phlegm a [place] changes about the prior umbilical hernia repair. There is a small pocket of fluid inferior to the mesh measuring approximately 4 cm. This is stable from prior study. Musculoskeletal. No acute displaced fractures. IMPRESSION: 1. There are  dilated loops of small bowel in the low midline abdomen adjacent to the patient's prior umbilical hernia repair. Findings likely represent a developing small bowel obstruction secondary to adhesions. 2. Persistent inflammatory changes about the patient's prior umbilical hernia repair with findings concerning for recurrent infraumbilical hernia as previously described. 3. There is a 5.1 cm right ovarian cystic mass. Further evaluation with an outpatient nonemergent ultrasound is recommended in 6-8 weeks. 4. Hepatic steatosis. Electronically Signed   By: Katherine Mantle M.D.   On: 01/02/2019 01:14   CT ABDOMEN PELVIS W CONTRAST  Result Date: 12/25/2018 CLINICAL DATA:  Distension with vomiting EXAM: CT ABDOMEN AND PELVIS WITH CONTRAST TECHNIQUE: Multidetector CT imaging of the abdomen and pelvis was performed using the standard protocol following bolus administration of intravenous contrast. CONTRAST:  OMNIPAQUE IOHEXOL 300 MG/ML  SOLN COMPARISON:   11/07/2016 CT, 09/01/2011, 12/09/2008 FINDINGS: Lower chest: Lung bases demonstrate no acute consolidation or effusion. The heart size is normal. Hepatobiliary: No focal liver abnormality is seen. No gallstones, gallbladder wall thickening, or biliary dilatation. Pancreas: Unremarkable. No pancreatic ductal dilatation or surrounding inflammatory changes. Spleen: Normal in size without focal abnormality. Adrenals/Urinary Tract: Adrenal glands are unremarkable. Kidneys are normal, without renal calculi, focal lesion, or hydronephrosis. Bladder is unremarkable. Stomach/Bowel: Stomach is within normal limits. Appendix appears normal. No evidence of bowel wall thickening, distention, or inflammatory changes. Vascular/Lymphatic: Nonaneurysmal aorta.  No significant adenopathy Reproductive: Uterus and bilateral adnexa are unremarkable. Other: Negative for free air or free fluid. Evidence of prior periumbilical hernia repair. Recurrent hernia in the infraumbilical region, best seen on sagittal views, series 7, image number 68. There is stranding and fluid within the hernia sac. No incarcerated bowel. No evidence for obstruction. Marked skin thickening and edema within the inferior pannus. Musculoskeletal: Degenerative changes.  No acute osseous abnormality IMPRESSION: 1. Negative for a bowel obstruction or bowel wall thickening. 2. Evidence of prior periumbilical hernia repair. Findings suspicious for recurrent infraumbilical hernia with some soft tissue inflammatory changes and fluid present in the hernia sac. No incarcerated bowel, and no evidence for obstruction related to the hernia. 3. Marked skin thickening and subcutaneous edema within the inferior pannus, correlate with physical examination Electronically Signed   By: Jasmine Pang M.D.   On: 12/25/2018 21:58   US Venous Img Upper Uni Right(DVT)  Result Date: 01/09/2019 CLINICAL DATA:  Pain and swelling of the right upper extremity EXAM: RIGHT UPPER EXTREMITY  VENOUS DOPPLER ULTRASOUND TECHNIQUE: Gray-scale sonography with graded compression, as well as color Doppler and duplex ultrasound were performed to evaluate the upper extremity deep venous system from the level of the subclavian vein and including the jugular, axillary, basilic, radial, ulnar and upper cephalic vein. Spectral Doppler was utilized to evaluate flow at rest and with distal augmentation maneuvers. COMPARISON:  None. FINDINGS: Contralateral Subclavian Vein: Respiratory phasicity is normal and symmetric with the symptomatic side. No evidence of thrombus. Normal compressibility. Internal Jugular Vein: No evidence of thrombus. Normal compressibility, respiratory phasicity and response to augmentation. Subclavian Vein: No evidence of thrombus. Normal compressibility, respiratory phasicity and response to augmentation. Axillary Vein: No evidence of thrombus. Normal compressibility, respiratory phasicity and response to augmentation. Cephalic Vein: There is occlusive thrombus at the level of the mid to distal cephalic vein. The thrombus extends from the upper arm to the level of the antecubital fossa. Basilic Vein: No evidence of thrombus. Normal compressibility, respiratory phasicity and response to augmentation. Brachial Veins: No evidence of thrombus. Normal compressibility, respiratory phasicity and  response to augmentation. Radial Veins: No evidence of thrombus. Normal compressibility, respiratory phasicity and response to augmentation. Ulnar Veins: No evidence of thrombus. Normal compressibility, respiratory phasicity and response to augmentation. Venous Reflux:  None visualized. Other Findings:  None visualized. IMPRESSION: Study is positive for an acute appearing occlusive DVT involving the right cephalic vein. Electronically Signed   By: Katherine Mantle M.D.   On: 01/09/2019 00:10   DG Abd 2 Views  Result Date: 01/06/2019 CLINICAL DATA:  48 year old female with recent abdominal pain. Query  small-bowel obstruction. EXAM: ABDOMEN - 2 VIEW COMPARISON:  Radiographs 01/03/2019. CT Abdomen and Pelvis 01/02/2019. FINDINGS: Upright and supine views. Stable enteric tube with side hole the level of the gastric body. Bowel gas pattern remains within normal limits. Oral contrast has cleared since 01/03/2019 except in the rectum. Stable abdominal visceral contours. No acute osseous abnormality identified. IMPRESSION: 1. Normal bowel gas pattern. Near complete clearance of oral contrast since 01/03/2019. 2. No free air.  Stable enteric tube. Electronically Signed   By: Odessa Fleming M.D.   On: 01/06/2019 09:54   DG Abd Portable 1V-Small Bowel Obstruction Protocol-initial, 8 hr delay  Result Date: 01/03/2019 CLINICAL DATA:  Small-bowel obstruction EXAM: PORTABLE ABDOMEN - 1 VIEW COMPARISON:  January 02, 2019 at 8:45 a.m. FINDINGS: Oral contrast is noted to the level of the colon. The enteric tube projects over the patient's stomach. There is no radiographic evidence for small bowel obstruction. IMPRESSION: 1. No radiographic evidence for small bowel obstruction. 2. Oral contrast is noted in the colon. Electronically Signed   By: Katherine Mantle M.D.   On: 01/03/2019 03:54   DG Abd Portable 1V  Result Date: 01/02/2019 CLINICAL DATA:  Status post NG tube placement. EXAM: PORTABLE ABDOMEN - 1 VIEW COMPARISON:  None. FINDINGS: NG tube is in good position with the tip and side-port in the stomach. IMPRESSION: As above. Electronically Signed   By: Drusilla Kanner M.D.   On: 01/02/2019 08:57    Labs Recent Results (from the past 2160 hour(s))  Novel Coronavirus, NAA ([facility])     Status: None   Collection Time: 11/28/18 12:00 AM   Specimen: Nasopharyngeal(NP) swabs in vial transport medium   NASOPHARYNGE  TESTING  Result Value Ref Range   SARS-CoV-2, NAA Not Detected Not Detected    Comment: Testing was performed using the cobas(R) SARS-CoV-2 test. This nucleic acid amplification test was developed and  its performance characteristics determined by [employer]. Nucleic acid amplification tests include PCR and TMA. This test has not been FDA cleared or approved. This test has been authorized by FDA under an Emergency Use Authorization (EUA). This test is only authorized for the duration of time the declaration that circumstances exist justifying the authorization of the emergency use of in vitro diagnostic tests for detection of SARS-CoV-2 virus and/or diagnosis of COVID-19 infection under section 564(b)(1) of the Act, 21 U.S.C. 161WRU-0(A) (1), unless the authorization is terminated or revoked sooner. When diagnostic testing is negative, the possibility of a false negative result should be considered in the context of a patient's recent exposures and the presence of clinical signs and symptoms consistent with COVID-19. An individual without symptoms  of COVID-19 and who is not shedding SARS-CoV-2 virus would expect to have a negative (not detected) result in this assay.   Urinalysis, Complete w Microscopic     Status: Abnormal   Collection Time: 12/25/18  7:16 PM  Result Value Ref Range   Color, Urine RED (A) YELLOW  Comment: BIOCHEMICALS MAY BE AFFECTED BY COLOR   APPearance HAZY (A) CLEAR   Specific Gravity, Urine 1.009 1.005 - 1.030   pH 6.0 5.0 - 8.0   Glucose, UA NEGATIVE NEGATIVE mg/dL   Hgb urine dipstick LARGE (A) NEGATIVE   Bilirubin Urine NEGATIVE NEGATIVE   Ketones, ur NEGATIVE NEGATIVE mg/dL   Protein, ur 161 (A) NEGATIVE mg/dL   Nitrite NEGATIVE NEGATIVE   Leukocytes,Ua NEGATIVE NEGATIVE   RBC / HPF >50 (H) 0 - 5 RBC/hpf   WBC, UA >50 (H) 0 - 5 WBC/hpf   Bacteria, UA RARE (A) NONE SEEN   Squamous Epithelial / LPF 6-10 0 - 5   Mucus PRESENT     Comment: Performed at [facility], [address] [place], [place] [address]  CBC with Differential     Status: None   Collection Time: 12/25/18  7:20 PM  Result Value Ref Range   WBC 9.0 4.0 - 10.5  K/uL   RBC 5.07 3.87 - 5.11 MIL/uL   Hemoglobin 14.8 12.0 - 15.0 g/dL   HCT 54.0 98.1 - 19.1 %   MCV 88.2 80.0 - 100.0 fL   MCH 29.2 26.0 - 34.0 pg   MCHC 33.1 30.0 - 36.0 g/dL   RDW 47.8 29.5 - 62.1 %   Platelets 197 150 - 400 K/uL   nRBC 0.0 0.0 - 0.2 %   Neutrophils Relative % 55 %   Neutro Abs 4.9 1.7 - 7.7 K/uL   Lymphocytes Relative 36 %   Lymphs Abs 3.2 0.7 - 4.0 K/uL   Monocytes Relative 7 %   Monocytes Absolute 0.6 0.1 - 1.0 K/uL   Eosinophils Relative 2 %   Eosinophils Absolute 0.2 0.0 - 0.5 K/uL   Basophils Relative 0 %   Basophils Absolute 0.0 0.0 - 0.1 K/uL   Immature Granulocytes 0 %   Abs Immature Granulocytes 0.02 0.00 - 0.07 K/uL    Comment: Performed at [facility], [address] [place], [place] [address]  Comprehensive metabolic panel     Status: Abnormal   Collection Time: 12/25/18  7:20 PM  Result Value Ref Range   Sodium 137 135 - 145 mmol/L   Potassium 3.6 3.5 - 5.1 mmol/L   Chloride 104 98 - 111 mmol/L   CO2 25 22 - 32 mmol/L   Glucose, Bld 122 (H) 70 - 99 mg/dL   BUN 9 6 - 20 mg/dL   Creatinine, Ser 7.84 0.44 - 1.00 mg/dL   Calcium 8.9 8.9 - 69.6 mg/dL   Total Protein 6.6 6.5 - 8.1 g/dL   Albumin 3.9 3.5 - 5.0 g/dL   AST 19 15 - 41 U/L   ALT 18 0 - 44 U/L   Alkaline Phosphatase 44 38 - 126 U/L   Total Bilirubin 2.0 (H) 0.3 - 1.2 mg/dL   GFR calc non Af Amer >60 >60 mL/min   GFR calc Af Amer >60 >60 mL/min   Anion gap 8 5 - 15    Comment: Performed at [facility], [address] [place], [place] [address]  Lipase, blood     Status: None   Collection Time: 12/25/18  7:20 PM  Result Value Ref Range   Lipase 28 11 - 51 U/L    Comment: Performed at [facility], [address] [place], [place] [address]  Pregnancy, urine POC     Status: None   Collection Time: 12/25/18  7:29 PM  Result Value Ref  Range   Preg Test, Ur NEGATIVE NEGATIVE    Comment:        THE SENSITIVITY OF THIS METHODOLOGY IS >24 mIU/mL     SARS CORONAVIRUS 2 (TAT 6-24 HRS) Nasopharyngeal Nasopharyngeal Swab     Status: None   Collection Time: 12/25/18 10:22 PM   Specimen: Nasopharyngeal Swab  Result Value Ref Range   SARS Coronavirus 2 NEGATIVE NEGATIVE    Comment: (NOTE) SARS-CoV-2 target nucleic acids are NOT DETECTED. The SARS-CoV-2 RNA is generally detectable in upper and lower respiratory specimens during the acute phase of infection. Negative results do not preclude SARS-CoV-2 infection, do not rule out co-infections with other pathogens, and should not be used as the sole basis for treatment or other patient management decisions. Negative results must be combined with clinical observations, patient history, and epidemiological information. The expected result is Negative. Fact Sheet for Patients: HairSlick.no Fact Sheet for Healthcare Providers: quierodirigir.com This test is not yet approved or cleared by the [place] FDA and  has been authorized for detection and/or diagnosis of SARS-CoV-2 by FDA under an Emergency Use Authorization (EUA). This EUA will remain  in effect (meaning this test can be used) for the duration of the COVID-19 declaration under Section 56 4(b)(1) of the Act, 21 U.S.C. section 360bbb-3(b)(1), unless the authorization is terminated or revoked sooner. Performed at [facility], [address] [place], [place] [address]   CBC     Status: Abnormal   Collection Time: 01/01/19  9:26 PM  Result Value Ref Range   WBC 10.0 4.0 - 10.5 K/uL   RBC 5.25 (H) 3.87 - 5.11 MIL/uL   Hemoglobin 15.5 (H) 12.0 - 15.0 g/dL   HCT 25.0 03.7 - 04.8 %   MCV 87.0 80.0 - 100.0 fL   MCH 29.5 26.0 - 34.0 pg   MCHC 33.9 30.0 - 36.0 g/dL   RDW 88.9 16.9 - 45.0 %   Platelets 229 150 - 400 K/uL   nRBC 0.0 0.0 - 0.2 %    Comment: Performed at [facility], [address] [place], [place] [address]  Comprehensive metabolic panel      Status: Abnormal   Collection Time: 01/01/19  9:26 PM  Result Value Ref Range   Sodium 138 135 - 145 mmol/L   Potassium 4.0 3.5 - 5.1 mmol/L   Chloride 106 98 - 111 mmol/L   CO2 23 22 - 32 mmol/L   Glucose, Bld 115 (H) 70 - 99 mg/dL   BUN 9 6 - 20 mg/dL   Creatinine, Ser 8.00 0.44 - 1.00 mg/dL   Calcium 9.1 8.9 - 34.9 mg/dL   Total Protein 7.1 6.5 - 8.1 g/dL   Albumin 4.0 3.5 - 5.0 g/dL   AST 19 15 - 41 U/L   ALT 19 0 - 44 U/L   Alkaline Phosphatase 43 38 - 126 U/L   Total Bilirubin 1.2 0.3 - 1.2 mg/dL   GFR calc non Af Amer >60 >60 mL/min   GFR calc Af Amer >60 >60 mL/min   Anion gap 9 5 - 15    Comment: Performed at [facility], [address] [place], [place] [address]  Urinalysis, Complete w Microscopic     Status: Abnormal   Collection Time: 01/01/19  9:26 PM  Result Value Ref Range   Color, Urine YELLOW (A) YELLOW   APPearance HAZY (A) CLEAR   Specific Gravity, Urine 1.019 1.005 - 1.030   pH 6.0 5.0 -  8.0   Glucose, UA NEGATIVE NEGATIVE mg/dL   Hgb urine dipstick NEGATIVE NEGATIVE   Bilirubin Urine NEGATIVE NEGATIVE   Ketones, ur NEGATIVE NEGATIVE mg/dL   Protein, ur NEGATIVE NEGATIVE mg/dL   Nitrite NEGATIVE NEGATIVE   Leukocytes,Ua TRACE (A) NEGATIVE   WBC, UA 6-10 0 - 5 WBC/hpf   Bacteria, UA NONE SEEN NONE SEEN   Squamous Epithelial / LPF 11-20 0 - 5   Mucus PRESENT     Comment: Performed at [facility], [address] [place], [place] [address]  Lipase, blood     Status: None   Collection Time: 01/01/19  9:26 PM  Result Value Ref Range   Lipase 29 11 - 51 U/L    Comment: Performed at [facility], [address] [place], [place] [address]  Pregnancy, urine POC     Status: None   Collection Time: 01/01/19  9:49 PM  Result Value Ref Range   Preg Test, Ur NEGATIVE NEGATIVE    Comment:        THE SENSITIVITY OF THIS METHODOLOGY IS >24 mIU/mL   HIV Antibody (routine testing w rflx)     Status: None   Collection Time:  01/02/19  2:04 PM  Result Value Ref Range   HIV Screen 4th Generation wRfx NON REACTIVE NON REACTIVE    Comment: Performed at [facility], [address], [place], [place] [address]  SARS CORONAVIRUS 2 (TAT 6-24 HRS) Nasopharyngeal Nasopharyngeal Swab     Status: None   Collection Time: 01/02/19  2:37 PM   Specimen: Nasopharyngeal Swab  Result Value Ref Range   SARS Coronavirus 2 NEGATIVE NEGATIVE    Comment: (NOTE) SARS-CoV-2 target nucleic acids are NOT DETECTED. The SARS-CoV-2 RNA is generally detectable in upper and lower respiratory specimens during the acute phase of infection. Negative results do not preclude SARS-CoV-2 infection, do not rule out co-infections with other pathogens, and should not be used as the sole basis for treatment or other patient management decisions. Negative results must be combined with clinical observations, patient history, and epidemiological information. The expected result is Negative. Fact Sheet for Patients: HairSlick.no Fact Sheet for Healthcare Providers: quierodirigir.com This test is not yet approved or cleared by the [place] FDA and  has been authorized for detection and/or diagnosis of SARS-CoV-2 by FDA under an Emergency Use Authorization (EUA). This EUA will remain  in effect (meaning this test can be used) for the duration of the COVID-19 declaration under Section 56 4(b)(1) of the Act, 21 U.S.C. section 360bbb-3(b)(1), unless the authorization is terminated or revoked sooner. Performed at [facility], [address] [place], [place] [address]   Troponin I (High Sensitivity)     Status: None   Collection Time: 01/02/19  4:44 PM  Result Value Ref Range   Troponin I (High Sensitivity) <2 <18 ng/L    Comment: (NOTE) Elevated high sensitivity troponin I (hsTnI) values and significant  changes across serial measurements may suggest ACS but many other  chronic  and acute conditions are known to elevate hsTnI results.  Refer to the "Links" section for chest pain algorithms and additional  guidance. Performed at [facility], [address] [place], [place] [address]   Troponin I (High Sensitivity)     Status: None   Collection Time: 01/02/19  6:31 PM  Result Value Ref Range   Troponin I (High Sensitivity) 2 <18 ng/L    Comment: (NOTE) Elevated high sensitivity troponin  I (hsTnI) values and significant  changes across serial measurements may suggest ACS but many other  chronic and acute conditions are known to elevate hsTnI results.  Refer to the "Links" section for chest pain algorithms and additional  guidance. Performed at [facility], [address] [place], [place] [address]   Basic metabolic panel     Status: Abnormal   Collection Time: 01/04/19  3:45 AM  Result Value Ref Range   Sodium 141 135 - 145 mmol/L   Potassium 3.9 3.5 - 5.1 mmol/L   Chloride 106 98 - 111 mmol/L   CO2 27 22 - 32 mmol/L   Glucose, Bld 114 (H) 70 - 99 mg/dL   BUN 9 6 - 20 mg/dL   Creatinine, Ser 6.040.66 0.44 - 1.00 mg/dL   Calcium 8.5 (L) 8.9 - 10.3 mg/dL   GFR calc non Af Amer >60 >60 mL/min   GFR calc Af Amer >60 >60 mL/min   Anion gap 8 5 - 15    Comment: Performed at [facility], [address] [place], [place] [address]  Magnesium     Status: None   Collection Time: 01/04/19  3:45 AM  Result Value Ref Range   Magnesium 2.2 1.7 - 2.4 mg/dL    Comment: Performed at [facility], [address] [place], [place] [address]    Assessment/Plan:  SBO (small bowel obstruction) [facility](HCC) Was the reason for her recent hospitalization.  Seems to be improving from this  Superficial thrombophlebitis An ultrasound was performed at the hospital that was read out as an acute DVT of the right cephalic vein.  This would be an incorrect report given the fact that the cephalic vein is a superficial vein and not a deep vein.   Reading through the report, there does not appear to be a true thrombosis in the deep vein of the arm.   At this point, we discussed that this is a superficial thrombophlebitis and not a deep venous thrombosis so she wants to come off of anticoagulation is certainly reasonable.  She does.  She will start 325 mg aspirin and take that daily.  I would do aspirin therapy for a minimum of 6 months and potentially switch her over to a baby aspirin at some point at that time.  I discussed the extremely small risk of pulmonary embolus and the small risk of propagation.  I will plan to repeat an ultrasound in about a month to assess for propagation or worsening issues.  Continue elevation, warm compresses, and aspirin therapy.      Festus BarrenJason Viki Carrera 01/14/2019, 3:32 PM   This note was created with Dragon medical transcription system.  Any errors from dictation are unintentional.

## 2019-01-15 ENCOUNTER — Telehealth (INDEPENDENT_AMBULATORY_CARE_PROVIDER_SITE_OTHER): Payer: Self-pay

## 2019-01-15 NOTE — Telephone Encounter (Signed)
Patient left a message stating with her visit with Dr Lucky Cowboy they discuss about the transition from Eliquis to Aspirin 325mg  and the patient plans to start taking Aspirin 325mg  today. The patient would like to how often she should take it. I informed the patient to take Aspirin 325mg  one time daily. Patient verbalized understanding with medical advice.

## 2019-01-21 ENCOUNTER — Ambulatory Visit
Admission: RE | Admit: 2019-01-21 | Discharge: 2019-01-21 | Disposition: A | Discharge status: Home / Self Care | Payer: Medicaid Other | Source: Ambulatory Visit | Attending: Family Medicine | Admitting: Family Medicine

## 2019-01-21 DIAGNOSIS — Z0000 Encounter for general adult medical examination without abnormal findings: Secondary | ICD-10-CM | POA: Insufficient documentation

## 2019-01-21 DIAGNOSIS — Z1231 Encounter for screening mammogram for malignant neoplasm of breast: Secondary | ICD-10-CM | POA: Insufficient documentation

## 2019-01-21 IMAGING — MG DIGITAL SCREENING BILAT W/ TOMO W/ CAD
6 of 12 series · 6 of 36 positions shown · non-contrast
Comparison: Previous exam(s).

ACR Breast Density Category a: The breast tissue is almost entirely
fatty.

CLINICAL DATA: Screening.

EXAM:
DIGITAL SCREENING BILATERAL MAMMOGRAM WITH TOMO AND CAD

[R CC synth-2D (1 of 2)]
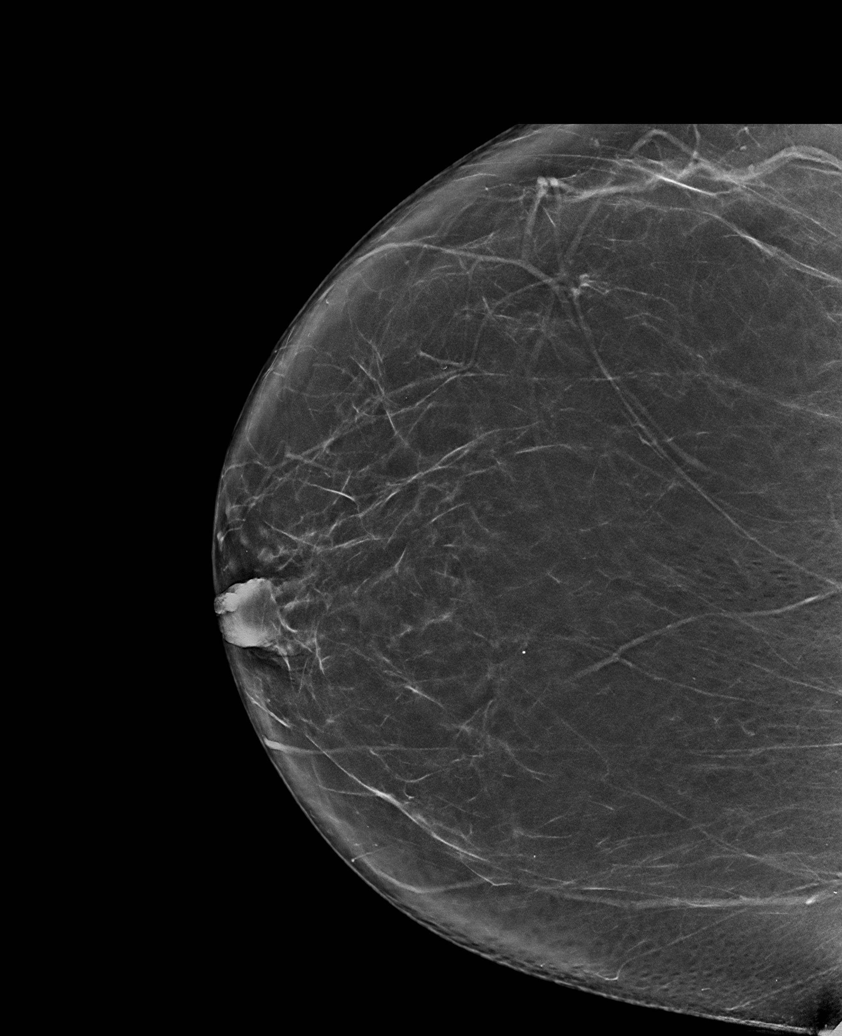

[R CC synth-2D (2 of 2)]
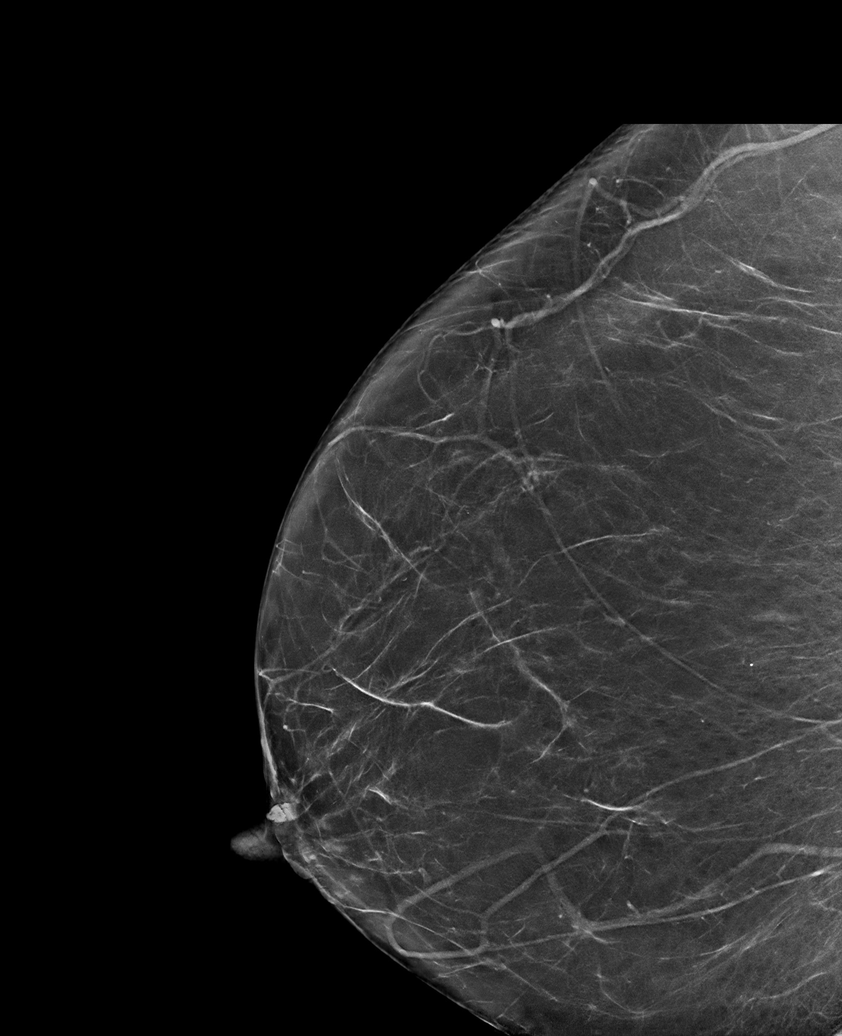

[L CC synth-2D]
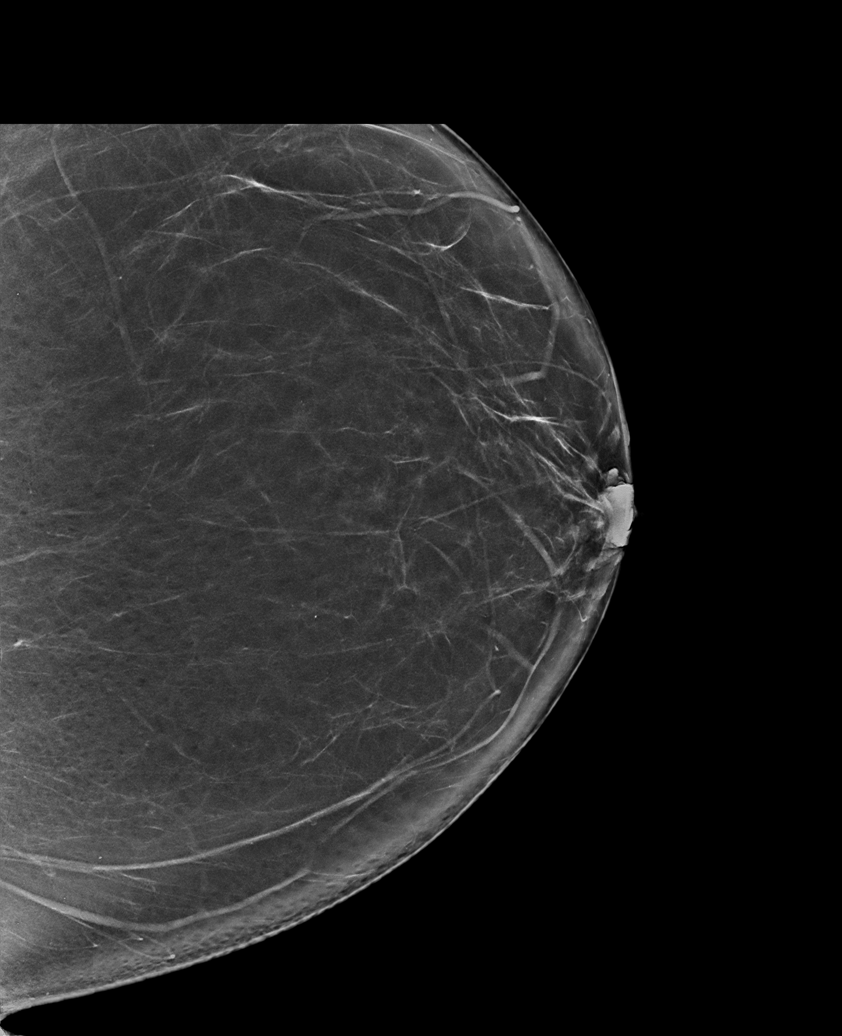

[R MLO synth-2D]
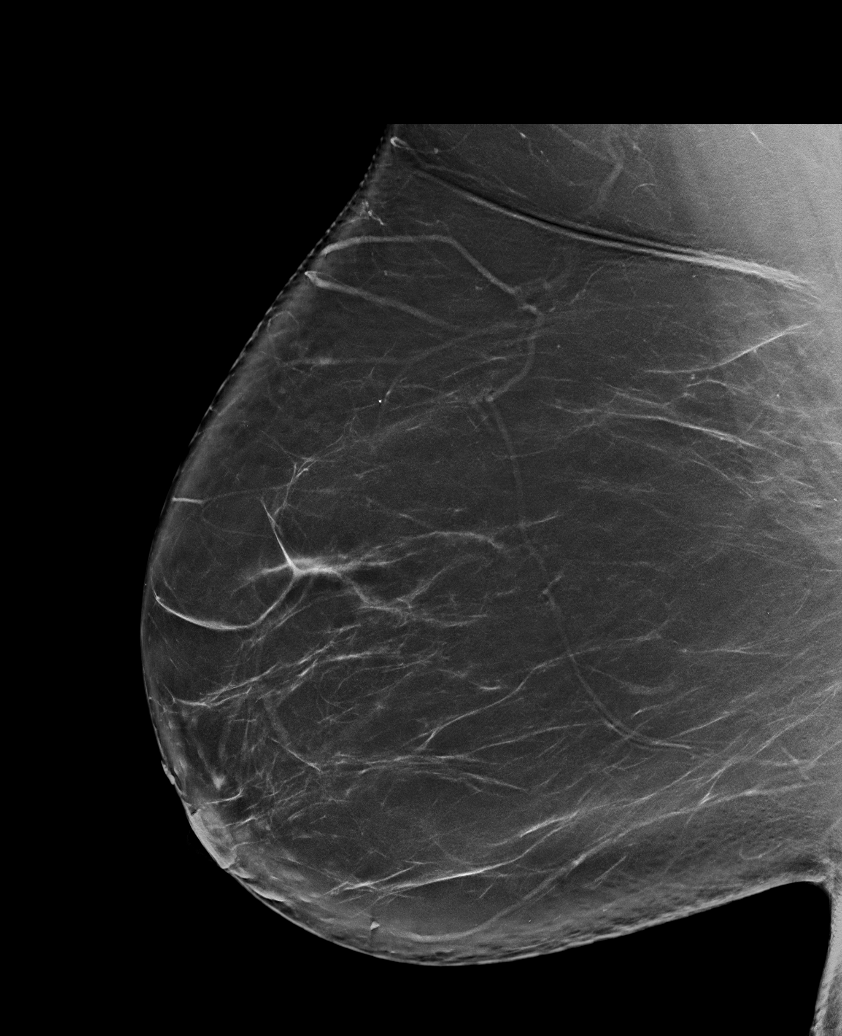

[L MLO synth-2D]
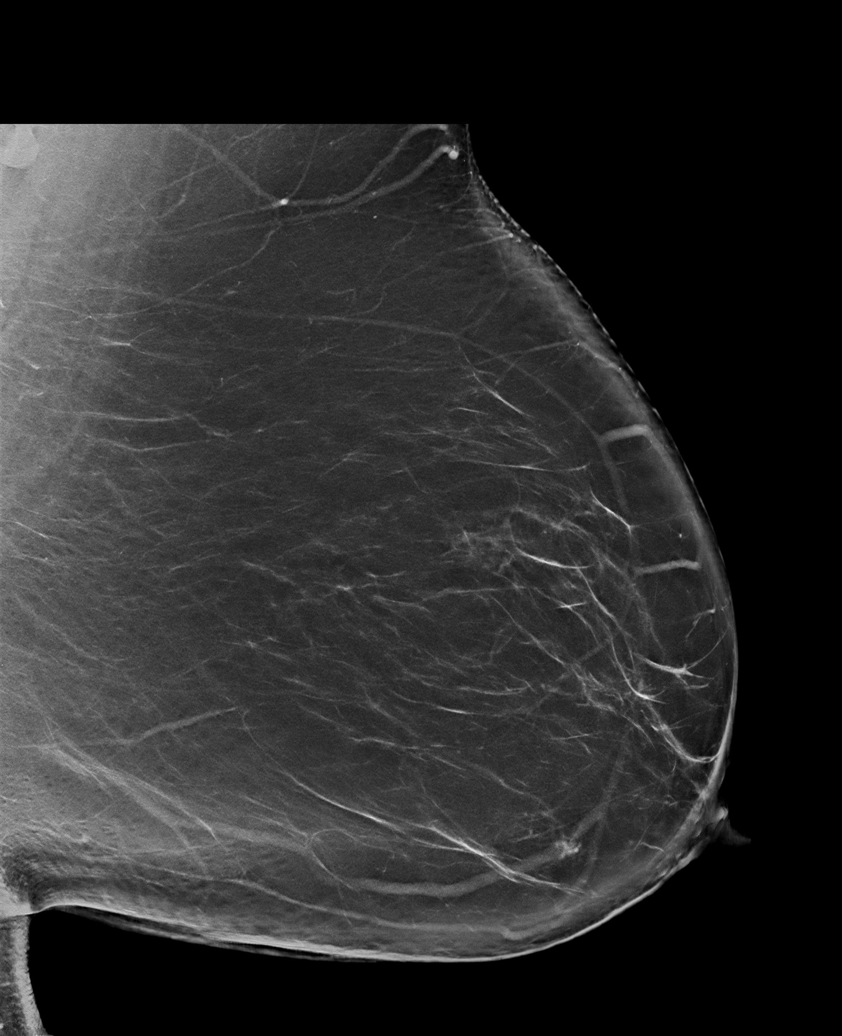

[L XCCL synth-2D]
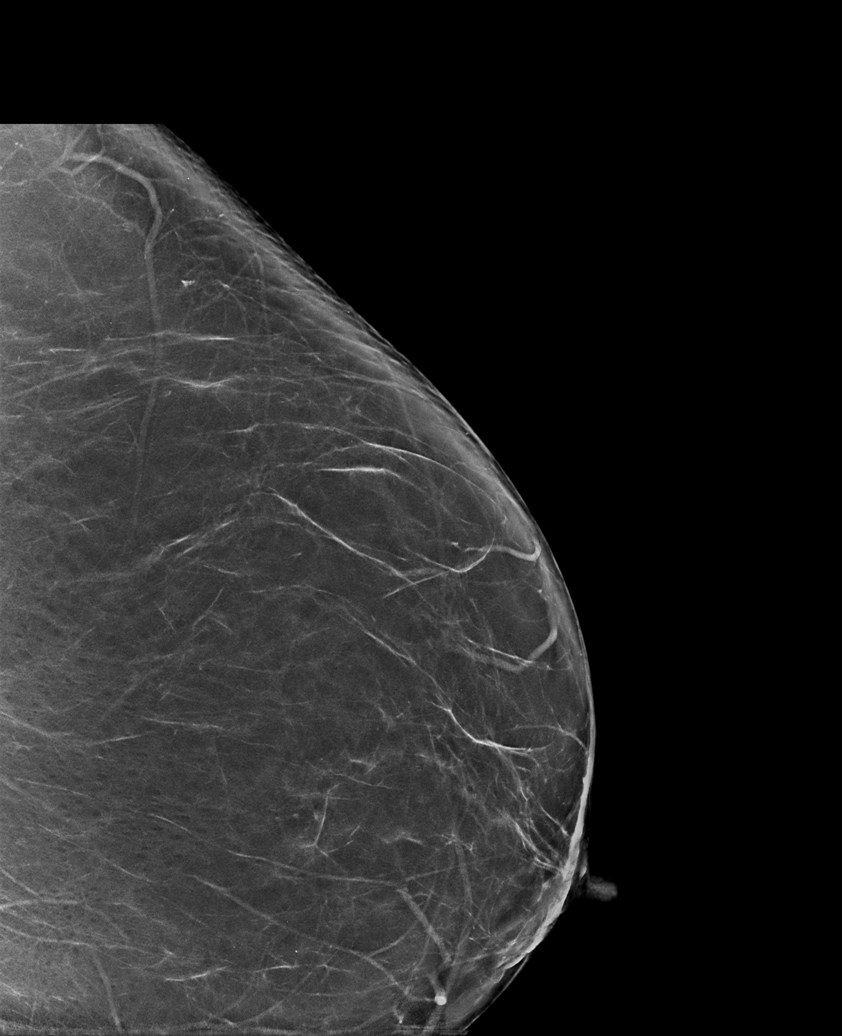

[6 of 36 positions shown; findings below may reference images not displayed]

FINDINGS: There are no findings suspicious for malignancy. Images were
processed with CAD.
IMPRESSION: No mammographic evidence of malignancy. A result letter of this
screening mammogram will be mailed directly to the patient.

RECOMMENDATION:
Screening mammogram in one year. (Code:[TA])

BI-RADS CATEGORY  1: Negative.

## 2019-01-22 ENCOUNTER — Other Ambulatory Visit (INDEPENDENT_AMBULATORY_CARE_PROVIDER_SITE_OTHER): Payer: Self-pay

## 2019-02-07 ENCOUNTER — Other Ambulatory Visit: Payer: Medicaid Other

## 2019-02-18 ENCOUNTER — Encounter (INDEPENDENT_AMBULATORY_CARE_PROVIDER_SITE_OTHER): Payer: Medicaid Other

## 2019-02-18 ENCOUNTER — Ambulatory Visit (INDEPENDENT_AMBULATORY_CARE_PROVIDER_SITE_OTHER): Payer: Medicaid Other | Admitting: Vascular Surgery

## 2019-03-04 ENCOUNTER — Ambulatory Visit (INDEPENDENT_AMBULATORY_CARE_PROVIDER_SITE_OTHER): Payer: Medicaid Other | Admitting: Vascular Surgery

## 2019-03-04 ENCOUNTER — Encounter (INDEPENDENT_AMBULATORY_CARE_PROVIDER_SITE_OTHER): Payer: Medicaid Other

## 2019-03-10 ENCOUNTER — Other Ambulatory Visit: Payer: Self-pay | Admitting: Family Medicine

## 2019-03-10 DIAGNOSIS — N83201 Unspecified ovarian cyst, right side: Secondary | ICD-10-CM

## 2019-03-17 ENCOUNTER — Other Ambulatory Visit: Payer: Self-pay

## 2019-03-17 ENCOUNTER — Ambulatory Visit
Admission: RE | Admit: 2019-03-17 | Discharge: 2019-03-17 | Disposition: A | Discharge status: Home / Self Care | Payer: Medicaid Other | Source: Ambulatory Visit | Attending: Family Medicine | Admitting: Family Medicine

## 2019-03-17 DIAGNOSIS — N83201 Unspecified ovarian cyst, right side: Secondary | ICD-10-CM | POA: Diagnosis not present

## 2019-03-25 ENCOUNTER — Ambulatory Visit (INDEPENDENT_AMBULATORY_CARE_PROVIDER_SITE_OTHER): Payer: Medicaid Other | Admitting: Vascular Surgery

## 2019-03-25 ENCOUNTER — Ambulatory Visit (INDEPENDENT_AMBULATORY_CARE_PROVIDER_SITE_OTHER): Payer: Medicaid Other

## 2019-03-25 ENCOUNTER — Other Ambulatory Visit: Payer: Self-pay

## 2019-03-25 ENCOUNTER — Encounter (INDEPENDENT_AMBULATORY_CARE_PROVIDER_SITE_OTHER): Payer: Self-pay | Admitting: Vascular Surgery

## 2019-03-25 VITALS — BP 130/83 | HR 73 | Resp 16 | Ht 64.0 in | Wt 353.0 lb

## 2019-03-25 DIAGNOSIS — I808 Phlebitis and thrombophlebitis of other sites: Secondary | ICD-10-CM

## 2019-03-25 DIAGNOSIS — K56609 Unspecified intestinal obstruction, unspecified as to partial versus complete obstruction: Secondary | ICD-10-CM

## 2019-03-25 NOTE — Progress Notes (Signed)
MRN : [id number]  Shelia Cross is a 49 y.o. (Nov 18, 1970) female who presents with chief complaint of  Chief Complaint  Patient presents with  . Follow-up    1 mo. RLE VEN DVT   .  History of Present Illness: Patient returns today in follow up of her right arm superficial thrombophlebitis following an IV when she was hospitalized for small bowel obstruction a couple of months ago.  The arm feels fine.  There is no further redness or swelling.  She is transition from Eliquis over to aspirin 325 mg daily which she is tolerating well.  Duplex today shows no evidence of right upper extremity DVT or superficial thrombophlebitis with resolution of her previous cephalic vein superficial thrombophlebitis.  The cephalic vein is compressible and appears patent today.  Current Outpatient Medications  Medication Sig Dispense Refill  . aspirin 325 MG tablet Take 325 mg by mouth daily.     No current facility-administered medications for this visit.    Past Medical History:  Diagnosis Date  . Hernia cerebri (HCC)   . Morbid obesity (HCC)   . Small bowel obstruction [facility])     Past Surgical History:  Procedure Laterality Date  . HERNIA REPAIR       Social History   Tobacco Use  . Smoking status: Never Smoker  . Smokeless tobacco: Never Used  Substance Use Topics  . Alcohol use: Not Currently    Comment: social  . Drug use: Never    Family History  Problem Relation Age of Onset  . Breast cancer Maternal Aunt      No Known Allergies   REVIEW OF SYSTEMS (Negative unless checked)  Constitutional: [] ?Weight loss  [] ?Fever  [] ?Chills Cardiac: [] ?Chest pain   [] ?Chest pressure   [] ?Palpitations   [] ?Shortness of breath when laying flat   [] ?Shortness of breath at rest   [] ?Shortness of breath with exertion. Vascular:  [] ?Pain in legs with walking   [] ?Pain in legs at rest   [] ?Pain in legs when laying flat   [] ?Claudication   [] ?Pain in feet when walking  [] ?Pain in feet at rest   [] ?Pain in feet when laying flat   [] ?History of DVT   [x] ?Phlebitis   [] ?Swelling in legs   [] ?Varicose veins   [] ?Non-healing ulcers Pulmonary:   [] ?Uses home oxygen   [] ?Productive cough   [] ?Hemoptysis   [] ?Wheeze  [] ?COPD   [] ?Asthma Neurologic:  [] ?Dizziness  [] ?Blackouts   [] ?Seizures   [] ?History of stroke   [] ?History of TIA  [] ?Aphasia   [] ?Temporary blindness   [] ?Dysphagia   [] ?Weakness or numbness in arms   [] ?Weakness or numbness in legs Musculoskeletal:  [] ?Arthritis   [] ?Joint swelling   [] ?Joint pain   [] ?Low back pain Hematologic:  [] ?Easy bruising  [] ?Easy bleeding   [] ?Hypercoagulable state   [] ?Anemic  [] ?Hepatitis Gastrointestinal:  [] ?Blood in stool   [] ?Vomiting blood  [] ?Gastroesophageal reflux/heartburn   [x] ?Abdominal pain Genitourinary:  [] ?Chronic kidney disease   [] ?Difficult urination  [] ?Frequent urination  [] ?Burning with urination   [] ?Hematuria Skin:  [] ?Rashes   [] ?Ulcers   [] ?Wounds Psychological:  [] ?History of anxiety   [] ? History of major depression  Physical Examination  BP 130/83 (BP Location: Left Arm)   Pulse 73   Resp 16   Ht 5\' 4"  (1.626 m)   Wt (!) 353 lb (160.1 kg)   BMI 60.59 kg/m  Gen:  WD/WN, NAD Head: [place]/AT, No temporalis wasting. Ear/Nose/Throat: Hearing grossly intact, nares w/o erythema  or drainage Eyes: Conjunctiva clear. Sclera non-icteric Neck: Supple.  Trachea midline Pulmonary:  Good air movement, no use of accessory muscles.  Cardiac: RRR, no JVD Vascular:  Vessel Right Left  Radial Palpable Palpable                   Musculoskeletal: M/S 5/5 throughout.  No deformity or atrophy.  No redness or edema in the right upper extremity. Neurologic: Sensation grossly intact in extremities.  Symmetrical.  Speech is fluent.  Psychiatric: Judgment intact, Mood & affect appropriate for pt's clinical situation. Dermatologic: No rashes or ulcers noted.  No cellulitis or open wounds.       Labs Recent Results (from the past  2160 hour(s))  Urinalysis, Complete w Microscopic     Status: Abnormal   Collection Time: 12/25/18  7:16 PM  Result Value Ref Range   Color, Urine RED (A) YELLOW    Comment: BIOCHEMICALS MAY BE AFFECTED BY COLOR   APPearance HAZY (A) CLEAR   Specific Gravity, Urine 1.009 1.005 - 1.030   pH 6.0 5.0 - 8.0   Glucose, UA NEGATIVE NEGATIVE mg/dL   Hgb urine dipstick LARGE (A) NEGATIVE   Bilirubin Urine NEGATIVE NEGATIVE   Ketones, ur NEGATIVE NEGATIVE mg/dL   Protein, ur 681 (A) NEGATIVE mg/dL   Nitrite NEGATIVE NEGATIVE   Leukocytes,Ua NEGATIVE NEGATIVE   RBC / HPF >50 (H) 0 - 5 RBC/hpf   WBC, UA >50 (H) 0 - 5 WBC/hpf   Bacteria, UA RARE (A) NONE SEEN   Squamous Epithelial / LPF 6-10 0 - 5   Mucus PRESENT     Comment: Performed at [facility], [address] [place], [place] [address]  CBC with Differential     Status: None   Collection Time: 12/25/18  7:20 PM  Result Value Ref Range   WBC 9.0 4.0 - 10.5 K/uL   RBC 5.07 3.87 - 5.11 MIL/uL   Hemoglobin 14.8 12.0 - 15.0 g/dL   HCT 20.3 55.9 - 74.1 %   MCV 88.2 80.0 - 100.0 fL   MCH 29.2 26.0 - 34.0 pg   MCHC 33.1 30.0 - 36.0 g/dL   RDW 63.8 45.3 - 64.6 %   Platelets 197 150 - 400 K/uL   nRBC 0.0 0.0 - 0.2 %   Neutrophils Relative % 55 %   Neutro Abs 4.9 1.7 - 7.7 K/uL   Lymphocytes Relative 36 %   Lymphs Abs 3.2 0.7 - 4.0 K/uL   Monocytes Relative 7 %   Monocytes Absolute 0.6 0.1 - 1.0 K/uL   Eosinophils Relative 2 %   Eosinophils Absolute 0.2 0.0 - 0.5 K/uL   Basophils Relative 0 %   Basophils Absolute 0.0 0.0 - 0.1 K/uL   Immature Granulocytes 0 %   Abs Immature Granulocytes 0.02 0.00 - 0.07 K/uL    Comment: Performed at [facility], [address] [place], [place] [address]  Comprehensive metabolic panel     Status: Abnormal   Collection Time: 12/25/18  7:20 PM  Result Value Ref Range   Sodium 137 135 - 145 mmol/L   Potassium 3.6 3.5 - 5.1 mmol/L   Chloride 104 98 - 111 mmol/L   CO2 25 22 -  32 mmol/L   Glucose, Bld 122 (H) 70 - 99 mg/dL   BUN 9 6 - 20 mg/dL   Creatinine, Ser 2.24 0.44 - 1.00 mg/dL   Calcium 8.9 8.9 - 82.5 mg/dL   Total Protein 6.6  6.5 - 8.1 g/dL   Albumin 3.9 3.5 - 5.0 g/dL   AST 19 15 - 41 U/L   ALT 18 0 - 44 U/L   Alkaline Phosphatase 44 38 - 126 U/L   Total Bilirubin 2.0 (H) 0.3 - 1.2 mg/dL   GFR calc non Af Amer >60 >60 mL/min   GFR calc Af Amer >60 >60 mL/min   Anion gap 8 5 - 15    Comment: Performed at [facility], [address] [place], [place] [address]  Lipase, blood     Status: None   Collection Time: 12/25/18  7:20 PM  Result Value Ref Range   Lipase 28 11 - 51 U/L    Comment: Performed at [facility], [address] [place], [place] [address]  Pregnancy, urine POC     Status: None   Collection Time: 12/25/18  7:29 PM  Result Value Ref Range   Preg Test, Ur NEGATIVE NEGATIVE    Comment:        THE SENSITIVITY OF THIS METHODOLOGY IS >24 mIU/mL   SARS CORONAVIRUS 2 (TAT 6-24 HRS) Nasopharyngeal Nasopharyngeal Swab     Status: None   Collection Time: 12/25/18 10:22 PM   Specimen: Nasopharyngeal Swab  Result Value Ref Range   SARS Coronavirus 2 NEGATIVE NEGATIVE    Comment: (NOTE) SARS-CoV-2 target nucleic acids are NOT DETECTED. The SARS-CoV-2 RNA is generally detectable in upper and lower respiratory specimens during the acute phase of infection. Negative results do not preclude SARS-CoV-2 infection, do not rule out co-infections with other pathogens, and should not be used as the sole basis for treatment or other patient management decisions. Negative results must be combined with clinical observations, patient history, and epidemiological information. The expected result is Negative. Fact Sheet for Patients: HairSlick.no Fact Sheet for Healthcare Providers: quierodirigir.com This test is not yet approved or cleared by the [place] FDA and  has  been authorized for detection and/or diagnosis of SARS-CoV-2 by FDA under an Emergency Use Authorization (EUA). This EUA will remain  in effect (meaning this test can be used) for the duration of the COVID-19 declaration under Section 56 4(b)(1) of the Act, 21 U.S.C. section 360bbb-3(b)(1), unless the authorization is terminated or revoked sooner. Performed at [facility], [address], [place], [place] [address]   CBC     Status: Abnormal   Collection Time: 01/01/19  9:26 PM  Result Value Ref Range   WBC 10.0 4.0 - 10.5 K/uL   RBC 5.25 (H) 3.87 - 5.11 MIL/uL   Hemoglobin 15.5 (H) 12.0 - 15.0 g/dL   HCT 62.9 52.8 - 41.3 %   MCV 87.0 80.0 - 100.0 fL   MCH 29.5 26.0 - 34.0 pg   MCHC 33.9 30.0 - 36.0 g/dL   RDW 24.4 01.0 - 27.2 %   Platelets 229 150 - 400 K/uL   nRBC 0.0 0.0 - 0.2 %    Comment: Performed at [facility], [address] [place], [place] [address]  Comprehensive metabolic panel     Status: Abnormal   Collection Time: 01/01/19  9:26 PM  Result Value Ref Range   Sodium 138 135 - 145 mmol/L   Potassium 4.0 3.5 - 5.1 mmol/L   Chloride 106 98 - 111 mmol/L   CO2 23 22 - 32 mmol/L   Glucose, Bld 115 (H) 70 - 99 mg/dL   BUN 9 6 - 20 mg/dL   Creatinine, Ser 4.03 0.44 -  1.00 mg/dL   Calcium 9.1 8.9 - 32.2 mg/dL   Total Protein 7.1 6.5 - 8.1 g/dL   Albumin 4.0 3.5 - 5.0 g/dL   AST 19 15 - 41 U/L   ALT 19 0 - 44 U/L   Alkaline Phosphatase 43 38 - 126 U/L   Total Bilirubin 1.2 0.3 - 1.2 mg/dL   GFR calc non Af Amer >60 >60 mL/min   GFR calc Af Amer >60 >60 mL/min   Anion gap 9 5 - 15    Comment: Performed at [facility], [address] [place], [place] [address]  Urinalysis, Complete w Microscopic     Status: Abnormal   Collection Time: 01/01/19  9:26 PM  Result Value Ref Range   Color, Urine YELLOW (A) YELLOW   APPearance HAZY (A) CLEAR   Specific Gravity, Urine 1.019 1.005 - 1.030   pH 6.0 5.0 - 8.0   Glucose, UA NEGATIVE NEGATIVE  mg/dL   Hgb urine dipstick NEGATIVE NEGATIVE   Bilirubin Urine NEGATIVE NEGATIVE   Ketones, ur NEGATIVE NEGATIVE mg/dL   Protein, ur NEGATIVE NEGATIVE mg/dL   Nitrite NEGATIVE NEGATIVE   Leukocytes,Ua TRACE (A) NEGATIVE   WBC, UA 6-10 0 - 5 WBC/hpf   Bacteria, UA NONE SEEN NONE SEEN   Squamous Epithelial / LPF 11-20 0 - 5   Mucus PRESENT     Comment: Performed at [facility], [address] [place], [place] [address]  Lipase, blood     Status: None   Collection Time: 01/01/19  9:26 PM  Result Value Ref Range   Lipase 29 11 - 51 U/L    Comment: Performed at [facility], [address] [place], [place] [address]  Pregnancy, urine POC     Status: None   Collection Time: 01/01/19  9:49 PM  Result Value Ref Range   Preg Test, Ur NEGATIVE NEGATIVE    Comment:        THE SENSITIVITY OF THIS METHODOLOGY IS >24 mIU/mL   HIV Antibody (routine testing w rflx)     Status: None   Collection Time: 01/02/19  2:04 PM  Result Value Ref Range   HIV Screen 4th Generation wRfx NON REACTIVE NON REACTIVE    Comment: Performed at [facility], [address], [place], [place] [address]  SARS CORONAVIRUS 2 (TAT 6-24 HRS) Nasopharyngeal Nasopharyngeal Swab     Status: None   Collection Time: 01/02/19  2:37 PM   Specimen: Nasopharyngeal Swab  Result Value Ref Range   SARS Coronavirus 2 NEGATIVE NEGATIVE    Comment: (NOTE) SARS-CoV-2 target nucleic acids are NOT DETECTED. The SARS-CoV-2 RNA is generally detectable in upper and lower respiratory specimens during the acute phase of infection. Negative results do not preclude SARS-CoV-2 infection, do not rule out co-infections with other pathogens, and should not be used as the sole basis for treatment or other patient management decisions. Negative results must be combined with clinical observations, patient history, and epidemiological information. The expected result is Negative. Fact Sheet for  Patients: HairSlick.no Fact Sheet for Healthcare Providers: quierodirigir.com This test is not yet approved or cleared by the [place] FDA and  has been authorized for detection and/or diagnosis of SARS-CoV-2 by FDA under an Emergency Use Authorization (EUA). This EUA will remain  in effect (meaning this test can be used) for the duration of the COVID-19 declaration under Section 56 4(b)(1) of the Act, 21 U.S.C. section 360bbb-3(b)(1), unless the authorization is  terminated or revoked sooner. Performed at [facility], [place] [address], [place], [place] [address]   Troponin I (High Sensitivity)     Status: None   Collection Time: 01/02/19  4:44 PM  Result Value Ref Range   Troponin I (High Sensitivity) <2 <18 ng/L    Comment: (NOTE) Elevated high sensitivity troponin I (hsTnI) values and significant  changes across serial measurements may suggest ACS but many other  chronic and acute conditions are known to elevate hsTnI results.  Refer to the "Links" section for chest pain algorithms and additional  guidance. Performed at [facility], [place], [place] [address]   Troponin I (High Sensitivity)     Status: None   Collection Time: 01/02/19  6:31 PM  Result Value Ref Range   Troponin I (High Sensitivity) 2 <18 ng/L    Comment: (NOTE) Elevated high sensitivity troponin I (hsTnI) values and significant  changes across serial measurements may suggest ACS but many other  chronic and acute conditions are known to elevate hsTnI results.  Refer to the "Links" section for chest pain algorithms and additional  guidance. Performed at [facility], [address] [place], [place] [address]   Basic metabolic panel     Status: Abnormal   Collection Time: 01/04/19  3:45 AM  Result Value Ref Range   Sodium 141 135 - 145 mmol/L   Potassium 3.9 3.5 - 5.1 mmol/L   Chloride 106 98 - 111 mmol/L    CO2 27 22 - 32 mmol/L   Glucose, Bld 114 (H) 70 - 99 mg/dL   BUN 9 6 - 20 mg/dL   Creatinine, Ser 0.66 0.44 - 1.00 mg/dL   Calcium 8.5 (L) 8.9 - 10.3 mg/dL   GFR calc non Af Amer >60 >60 mL/min   GFR calc Af Amer >60 >60 mL/min   Anion gap 8 5 - 15    Comment: Performed at [facility], [address] [place], [place] [address]  Magnesium     Status: None   Collection Time: 01/04/19  3:45 AM  Result Value Ref Range   Magnesium 2.2 1.7 - 2.4 mg/dL    Comment: Performed at [facility], [address] [place], [place] [address]    Radiology US PELVIC COMPLETE WITH TRANSVAGINAL  Result Date: 03/17/2019 CLINICAL DATA:  RIGHT ovarian cyst; premenopausal with uncertain LMP EXAM: TRANSABDOMINAL AND TRANSVAGINAL ULTRASOUND OF PELVIS TECHNIQUE: Both transabdominal and transvaginal ultrasound examinations of the pelvis were performed. Transabdominal technique was performed for global imaging of the pelvis including uterus, ovaries, adnexal regions, and pelvic cul-de-sac. It was necessary to proceed with endovaginal exam following the transabdominal exam to visualize the uterus, endometrium, and ovaries. COMPARISON:  None FINDINGS: Uterus Measurements: 8.9 x 4.4 x 5.5 cm = volume: 111 mL. Anteverted. Mildly heterogeneous myometrium. Probable small intramural leiomyoma at the upper uterus 10 x 8 x 8 mm. No additional masses. Endometrium Thickness: 9 mm.  No endometrial fluid or focal abnormality Right ovary Not visualized, likely obscured by bowel Left ovary Not visualized, likely obscured by bowel Other findings No free pelvic fluid or adnexal masses. IMPRESSION: Probable tiny intramural leiomyoma of of the upper uterus 10 mm diameter. Otherwise unremarkable uterus and endometrial complex. Nonvisualization of ovaries. Electronically Signed   By: Lavonia Dana M.D.   On: 03/17/2019 17:04    Assessment/Plan  SBO (small bowel obstruction) ([place]w) With hospitalization and IVs that with  a nidus for her superficial  thrombophlebitis couple of months ago.  No recurrent symptoms.  Superficial thrombophlebitis Duplex today shows no evidence of right upper extremity DVT or superficial thrombophlebitis with resolution of her previous cephalic vein superficial thrombophlebitis.  The cephalic vein is compressible and appears patent today. She will continue aspirin for several more months to try to avoid recurrent thrombosis.  No further vascular evaluation or therapy is necessary.  Return to clinic as needed    Festus BarrenJason Judson Tsan, MD  03/25/2019 11:43 AM    This note was created with Dragon medical transcription system.  Any errors from dictation are purely unintentional

## 2019-03-25 NOTE — Assessment & Plan Note (Signed)
Duplex today shows no evidence of right upper extremity DVT or superficial thrombophlebitis with resolution of her previous cephalic vein superficial thrombophlebitis.  The cephalic vein is compressible and appears patent today. She will continue aspirin for several more months to try to avoid recurrent thrombosis.  No further vascular evaluation or therapy is necessary.  Return to clinic as needed

## 2019-03-25 NOTE — Assessment & Plan Note (Signed)
With hospitalization and IVs that with a nidus for her superficial thrombophlebitis couple of months ago.  No recurrent symptoms.

## 2019-04-04 ENCOUNTER — Ambulatory Visit: Payer: Medicaid Other | Attending: Internal Medicine

## 2019-04-04 DIAGNOSIS — Z23 Encounter for immunization: Secondary | ICD-10-CM

## 2019-04-04 NOTE — Progress Notes (Signed)
   Covid-19 Vaccination Clinic  Name:  Shelia Cross    MRN: [id number] DOB: 03/11/1970  04/04/2019  [name] was observed post Covid-19 immunization for 15 minutes without incident. She was provided with Vaccine Information Sheet and instruction to access the V-Safe system.   [name] was instructed to call 911 with any severe reactions post vaccine: Marland Kitchen Difficulty breathing  . Swelling of face and throat  . A fast heartbeat  . A bad rash all over body  . Dizziness and weakness   Immunizations Administered    Name Date Dose VIS Date Route   Pfizer COVID-19 Vaccine 04/04/2019  8:31 AM 0.3 mL 01/03/2019 Intramuscular   Manufacturer: [employer], [employer]   Lot: [id number]   NDC: [address]-4

## 2019-04-30 ENCOUNTER — Ambulatory Visit: Payer: Medicaid Other | Attending: Internal Medicine

## 2019-04-30 DIAGNOSIS — Z23 Encounter for immunization: Secondary | ICD-10-CM

## 2019-04-30 NOTE — Progress Notes (Signed)
   Covid-19 Vaccination Clinic  Name:  Shelia Cross    MRN: [id number] DOB: July 22, 1970  04/30/2019  Shelia Cross was observed post Covid-19 immunization for 15 minutes without incident. She was provided with Vaccine Information Sheet and instruction to access the V-Safe system.   Shelia Cross was instructed to call 911 with any severe reactions post vaccine: Marland Kitchen Difficulty breathing  . Swelling of face and throat  . A fast heartbeat  . A bad rash all over body  . Dizziness and weakness   Immunizations Administered    Name Date Dose VIS Date Route   Pfizer COVID-19 Vaccine 04/30/2019  8:21 AM 0.3 mL 01/03/2019 Intramuscular   Manufacturer: [employer], [employer]   Lot: [id number] [id number]   NDC: [address]-9

## 2019-10-18 ENCOUNTER — Other Ambulatory Visit
Admission: RE | Admit: 2019-10-18 | Discharge: 2019-10-18 | Disposition: A | Discharge status: Home / Self Care | Payer: Medicaid Other | Source: Ambulatory Visit | Attending: Internal Medicine | Admitting: Internal Medicine

## 2019-10-18 DIAGNOSIS — M79661 Pain in right lower leg: Secondary | ICD-10-CM | POA: Insufficient documentation

## 2019-10-18 DIAGNOSIS — I8001 Phlebitis and thrombophlebitis of superficial vessels of right lower extremity: Secondary | ICD-10-CM | POA: Insufficient documentation

## 2019-10-18 DIAGNOSIS — M7989 Other specified soft tissue disorders: Secondary | ICD-10-CM | POA: Insufficient documentation

## 2019-10-18 LAB — FIBRIN DERIVATIVES D-DIMER (ARMC ONLY): Fibrin derivatives D-dimer (ARMC): 408.2 ng/mL (FEU) (ref 0.00–499.00)

## 2020-07-14 ENCOUNTER — Emergency Department
Admission: EM | Admit: 2020-07-14 | Discharge: 2020-07-14 | Disposition: A | Discharge status: Home / Self Care | Payer: Medicaid Other | Attending: Student in an Organized Health Care Education/Training Program | Admitting: Student in an Organized Health Care Education/Training Program

## 2020-07-14 ENCOUNTER — Emergency Department: Payer: Medicaid Other

## 2020-07-14 ENCOUNTER — Other Ambulatory Visit: Payer: Self-pay

## 2020-07-14 DIAGNOSIS — Z7982 Long term (current) use of aspirin: Secondary | ICD-10-CM | POA: Insufficient documentation

## 2020-07-14 DIAGNOSIS — K439 Ventral hernia without obstruction or gangrene: Secondary | ICD-10-CM | POA: Diagnosis not present

## 2020-07-14 DIAGNOSIS — R109 Unspecified abdominal pain: Secondary | ICD-10-CM | POA: Diagnosis present

## 2020-07-14 LAB — COMPREHENSIVE METABOLIC PANEL
ALT: 11 U/L (ref 0–44)
AST: 18 U/L (ref 15–41)
Albumin: 4 g/dL (ref 3.5–5.0)
Alkaline Phosphatase: 44 U/L (ref 38–126)
Anion gap: 5 (ref 5–15)
BUN: 7 mg/dL (ref 6–20)
CO2: 26 mmol/L (ref 22–32)
Calcium: 9.1 mg/dL (ref 8.9–10.3)
Chloride: 107 mmol/L (ref 98–111)
Creatinine, Ser: 0.78 mg/dL (ref 0.44–1.00)
GFR, Estimated: >60 mL/min (ref >60)
Glucose, Bld: 107 mg/dL — ABNORMAL HIGH (ref 70–99)
Potassium: 3.9 mmol/L (ref 3.5–5.1)
Sodium: 138 mmol/L (ref 135–145)
Total Bilirubin: 1 mg/dL (ref 0.3–1.2)
Total Protein: 6.9 g/dL (ref 6.5–8.1)

## 2020-07-14 LAB — CBC
HCT: 42.7 % (ref 36.0–46.0)
Hemoglobin: 14.8 g/dL (ref 12.0–15.0)
MCH: 29.9 pg (ref 26.0–34.0)
MCHC: 34.7 g/dL (ref 30.0–36.0)
MCV: 86.3 fL (ref 80.0–100.0)
Platelets: 217 10*3/uL (ref 150–400)
RBC: 4.95 MIL/uL (ref 3.87–5.11)
RDW: 13 % (ref 11.5–15.5)
WBC: 8.2 10*3/uL (ref 4.0–10.5)
nRBC: 0 % (ref 0.0–0.2)

## 2020-07-14 LAB — POC URINE PREG, ED: Preg Test, Ur: NEGATIVE

## 2020-07-14 NOTE — ED Triage Notes (Signed)
Pt to ED for abd pain to lower abdomen. States she is unsure if she tore mesh from hernia surgery years ago when getting up off couch yesterday.  Denies N/V/D

## 2020-07-14 NOTE — ED Provider Notes (Signed)
[facility] Emergency Department Provider Note    Event Date/Time   First MD Initiated Contact with Patient 07/14/20 1814     (approximate)  I have reviewed the triage vital signs and the nursing notes.   HISTORY  Chief Complaint Abdominal Pain    HPI Shelia Cross is a 50 y.o. female below listed past medical history status post hernia repair presents to the ER for acute abdominal pain primarily left lower quadrant and supra umbilical that occurred the other day which she was getting up from a low lying couch.  Felt a tearing sensation.  Since then is having persistent pain whenever she is standing.  No nausea or vomiting.  Is moving her bowels normally and passing gas.  No measured fevers.  Past Medical History:  Diagnosis Date   Hernia cerebri (HCC)    Morbid obesity (HCC)    Small bowel obstruction (HCC)    Family History  Problem Relation Age of Onset   Breast cancer Maternal Aunt    Past Surgical History:  Procedure Laterality Date   HERNIA REPAIR     Patient Active Problem List   Diagnosis Date Noted   Superficial thrombophlebitis 01/14/2019   Ovarian cyst, right 01/06/2019   SBO (small bowel obstruction) (HCC) 01/02/2019   Disruption of perineal wound, postpartum 09/26/2013   Irritable bowel syndrome 09/26/2013   Morbid obesity (HCC) 09/26/2013   Other disorder of lactation, postpartum condition or complication 09/26/2013      Prior to Admission medications   Medication Sig Start Date End Date Taking? Authorizing Provider  aspirin 325 MG tablet Take 325 mg by mouth daily.    [provider]    Allergies Patient has no known allergies.    Social History Social History   Tobacco Use   Smoking status: Never   Smokeless tobacco: Never  Substance Use Topics   Alcohol use: Not Currently    Comment: social   Drug use: Never    Review of Systems Patient denies headaches, rhinorrhea, blurry vision, numbness,  shortness of breath, chest pain, edema, cough, abdominal pain, nausea, vomiting, diarrhea, dysuria, fevers, rashes or hallucinations unless otherwise stated above in HPI. ____________________________________________   PHYSICAL EXAM:  VITAL SIGNS: Vitals:   07/14/20 1611  BP: (!) 135/92  Pulse: 83  Resp: 18  Temp: 98.1 F (36.7 C)  SpO2: 97%    Constitutional: Alert and oriented.  Eyes: Conjunctivae are normal.  Head: Atraumatic. Nose: No congestion/rhinnorhea. Mouth/Throat: Mucous membranes are moist.   Neck: No stridor. Painless ROM.  Cardiovascular: Normal rate, regular rhythm. Grossly normal heart sounds.  Good peripheral circulation. Respiratory: Normal respiratory effort.  No retractions. Lungs CTAB. Gastrointestinal: Soft, obese without clear palpable hernia noted.  No distention. No abdominal bruits. No CVA tenderness. Genitourinary:  Musculoskeletal: No lower extremity tenderness nor edema.  No joint effusions. Neurologic:  Normal speech and language. No gross focal neurologic deficits are appreciated. No facial droop Skin:  Skin is warm, dry and intact. No rash noted. Psychiatric: Mood and affect are normal. Speech and behavior are normal.  ____________________________________________   LABS (all labs ordered are listed, but only abnormal results are displayed)  Results for orders placed or performed during the hospital encounter of 07/14/20 (from the past 24 hour(s))  CBC     Status: None   Collection Time: 07/14/20  4:15 PM  Result Value Ref Range   WBC 8.2 4.0 - 10.5 K/uL   RBC 4.95 3.87 - 5.11 MIL/uL  Hemoglobin 14.8 12.0 - 15.0 g/dL   HCT 49.4 49.6 - 75.9 %   MCV 86.3 80.0 - 100.0 fL   MCH 29.9 26.0 - 34.0 pg   MCHC 34.7 30.0 - 36.0 g/dL   RDW 16.3 84.6 - 65.9 %   Platelets 217 150 - 400 K/uL   nRBC 0.0 0.0 - 0.2 %  Comprehensive metabolic panel     Status: Abnormal   Collection Time: 07/14/20  4:15 PM  Result Value Ref Range   Sodium 138 135 - 145  mmol/L   Potassium 3.9 3.5 - 5.1 mmol/L   Chloride 107 98 - 111 mmol/L   CO2 26 22 - 32 mmol/L   Glucose, Bld 107 (H) 70 - 99 mg/dL   BUN 7 6 - 20 mg/dL   Creatinine, Ser 9.35 0.44 - 1.00 mg/dL   Calcium 9.1 8.9 - 70.1 mg/dL   Total Protein 6.9 6.5 - 8.1 g/dL   Albumin 4.0 3.5 - 5.0 g/dL   AST 18 15 - 41 U/L   ALT 11 0 - 44 U/L   Alkaline Phosphatase 44 38 - 126 U/L   Total Bilirubin 1.0 0.3 - 1.2 mg/dL   GFR, Estimated >77 >93 mL/min   Anion gap 5 5 - 15  POC urine preg, ED (not at [facility])     Status: None   Collection Time: 07/14/20  4:18 PM  Result Value Ref Range   Preg Test, Ur NEGATIVE NEGATIVE   ____________________________________________ ____________________________________________  RADIOLOGY  I personally reviewed all radiographic images ordered to evaluate for the above acute complaints and reviewed radiology reports and findings.  These findings were personally discussed with the patient.  Please see medical record for radiology report.  ____________________________________________   PROCEDURES  Procedure(s) performed:  Procedures    Critical Care performed: no ____________________________________________   INITIAL IMPRESSION / ASSESSMENT AND PLAN / ED COURSE  Pertinent labs & imaging results that were available during my care of the patient were reviewed by me and considered in my medical decision making (see chart for details).   DDX: Hernia, strangulated hernia, obstruction, incarcerated hernia, musculoskeletal strain, diverticulitis, shingles, cellulitis  Shelia Cross is a 50 y.o. who presents to the ED with presentation as described above.  Patient nontoxic-appearing history of hernia previously repaired.  Given location of pain and previous history CT imaging will be ordered.  She is nontoxic-appearing not febrile well-perfused she is tolerating p.o.  She is declining any pain meds.  Clinical Course as of 07/14/20 1926  Wed Jul 14, 2020  1922 CT  imaging shows evidence of a recurrent ventral hernia without evidence obstruction.  Does not appear incarcerated or strangulated.  Does have some discomfort when attempting reduction.  Exam somewhat limited due to her obesity but I do believe I can feel that it is reducible at this time.  She not having nausea or vomiting.  Buttocks reassuring.  Do believe she stable and appropriate for close outpatient follow-up with general surgery.  Gust return precautions. [PR]    Clinical Course User Index [PR] Willy Eddy, MD    The patient was evaluated in Emergency Department today for the symptoms described in the history of present illness. He/she was evaluated in the context of the global COVID-19 pandemic, which necessitated consideration that the patient might be at risk for infection with the SARS-CoV-2 virus that causes COVID-19. Institutional protocols and algorithms that pertain to the evaluation of patients at risk for COVID-19 are in a state  of rapid change based on information released by regulatory bodies including the CDC and federal and state organizations. These policies and algorithms were followed during the patient's care in the ED.  As part of my medical decision making, I reviewed the following data within the electronic MEDICAL RECORD NUMBER Nursing notes reviewed and incorporated, Labs reviewed, notes from prior ED visits and  Controlled Substance Database   ____________________________________________   FINAL CLINICAL IMPRESSION(S) / ED DIAGNOSES  Final diagnoses:  Ventral hernia without obstruction or gangrene      NEW MEDICATIONS STARTED DURING THIS VISIT:  New Prescriptions   No medications on file     Note:  This document was prepared using Dragon voice recognition software and may include unintentional dictation errors.    Willy Eddy, MD 07/14/20 [phone]

## 2020-07-14 NOTE — ED Notes (Signed)
Patient transported to CT 

## 2020-07-14 NOTE — ED Notes (Signed)
This RN spoke with lab, lab reports they received specimens on patient prior to ED provider orders. Labs are being run now per lab.

## 2021-06-28 ENCOUNTER — Encounter: Payer: Self-pay | Admitting: Emergency Medicine

## 2021-06-28 ENCOUNTER — Ambulatory Visit
Admission: EM | Admit: 2021-06-28 | Discharge: 2021-06-28 | Disposition: A | Discharge status: Home / Self Care | Payer: Medicaid Other | Attending: Family Medicine | Admitting: Family Medicine

## 2021-06-28 DIAGNOSIS — J209 Acute bronchitis, unspecified: Secondary | ICD-10-CM | POA: Diagnosis not present

## 2021-06-28 DIAGNOSIS — H66002 Acute suppurative otitis media without spontaneous rupture of ear drum, left ear: Secondary | ICD-10-CM

## 2021-06-28 DIAGNOSIS — H10531 Contact blepharoconjunctivitis, right eye: Secondary | ICD-10-CM | POA: Diagnosis not present

## 2021-06-28 MED ORDER — PROMETHAZINE-DM 6.25-15 MG/5ML PO SYRP: 5.0000 mL | ORAL_SOLUTION | Freq: Three times a day (TID) | ORAL | 0 refills | Status: AC | PRN

## 2021-06-28 MED ORDER — PREDNISONE 20 MG PO TABS: 20.0000 mg | ORAL_TABLET | Freq: Every day | ORAL | 0 refills | Status: AC

## 2021-06-28 MED ORDER — POLYMYXIN B-TRIMETHOPRIM 10000-0.1 UNIT/ML-% OP SOLN: 2.0000 [drp] | Freq: Three times a day (TID) | OPHTHALMIC | 0 refills | Status: AC

## 2021-06-28 MED ORDER — AMOXICILLIN-POT CLAVULANATE 875-125 MG PO TABS: 1.0000 | ORAL_TABLET | Freq: Two times a day (BID) | ORAL | 0 refills | Status: AC

## 2021-06-28 NOTE — Discharge Instructions (Addendum)
Cough medications are often not covered by insurance, you can purchase out of pocket or purchase over the counter Delsym for cough.  Start eye drops tomorrow, continue warm compresses. Start all other medications today. Follow-up with primary care doctor if symptoms worsen or do not improve.

## 2021-06-28 NOTE — ED Provider Notes (Signed)
Renaldo Fiddler    CSN: [id number] Arrival date & time: 06/28/21  1709      History   Chief Complaint Chief Complaint  Patient presents with   Cough   Nasal Congestion   Sore Throat   Shortness of Breath    HPI Shelia Cross is a 51 y.o. female.   HPI Patient presents today with a two day history of cough, runny nose, left ear pain, SOB, and left eye lid swelling and purulent eye drainage. As the days has gone on, she unable to completely open her eye due to swelling and copious eye discharge. She recently recovered from URI within the last few weeks until 3 days ago. She is experiencing severe left ear pain.  To her knowledge she has been afebrile.  She endorses shortness of breath when she gets into a coughing spell.  She endorses fatigue.Her husband was sick over 1 week ago with similar course of symptoms. She is   Past Medical History:  Diagnosis Date   Hernia cerebri (HCC)    Morbid obesity (HCC)    Small bowel obstruction [facility])     Patient Active Problem List   Diagnosis Date Noted   Superficial thrombophlebitis 01/14/2019   Ovarian cyst, right 01/06/2019   SBO (small bowel obstruction) (HCC) 01/02/2019   Disruption of perineal wound, postpartum 09/26/2013   Irritable bowel syndrome 09/26/2013   Morbid obesity (HCC) 09/26/2013   Other disorder of lactation, postpartum condition or complication 09/26/2013    Past Surgical History:  Procedure Laterality Date   HERNIA REPAIR      OB History   No obstetric history on file.      Home Medications    Prior to Admission medications   Medication Sig Start Date End Date Taking? Authorizing Provider  amoxicillin-clavulanate (AUGMENTIN) 875-125 MG tablet Take 1 tablet by mouth 2 (two) times daily for 10 days. 06/28/21 07/08/21 Yes Bing Neighbors, FNP  predniSONE (DELTASONE) 20 MG tablet Take 1 tablet (20 mg total) by mouth daily with breakfast for 5 days. 06/28/21 07/03/21 Yes Bing Neighbors, FNP   promethazine-dextromethorphan (PROMETHAZINE-DM) 6.25-15 MG/5ML syrup Take 5 mLs by mouth 3 (three) times daily as needed for cough. 06/28/21  Yes Bing Neighbors, FNP  trimethoprim-polymyxin b (POLYTRIM) ophthalmic solution Place 2 drops into the right eye in the morning, at noon, and at bedtime for 7 days. 06/28/21 07/05/21 Yes Bing Neighbors, FNP  aspirin 325 MG tablet Take 325 mg by mouth daily.    [provider]    Family History Family History  Problem Relation Age of Onset   Breast cancer Maternal Aunt     Social History Social History   Tobacco Use   Smoking status: Never   Smokeless tobacco: Never  Vaping Use   Vaping Use: Never used  Substance Use Topics   Alcohol use: Not Currently    Comment: social   Drug use: Never     Allergies   Patient has no known allergies.   Review of Systems Review of Systems Pertinent negatives listed in HPI  Physical Exam Triage Vital Signs ED Triage Vitals  Enc Vitals Group     BP 06/28/21 1730 (!) 155/84     Pulse Rate 06/28/21 1730 100     Resp 06/28/21 1730 20     Temp 06/28/21 1730 98.6 F (37 C)     Temp Source 06/28/21 1730 Oral     SpO2 06/28/21 1730 96 %  Weight --      Height --      Head Circumference --      Peak Flow --      Pain Score 06/28/21 1731 6     Pain Loc --      Pain Edu? --      Excl. in [place]? --    No data found.  Updated Vital Signs BP (!) 155/84 (BP Location: Left Wrist)   Pulse 100   Temp 98.6 F (37 C) (Oral)   Resp 20   LMP  (LMP Unknown)   SpO2 96%   Visual Acuity Right Eye Distance:   Left Eye Distance:   Bilateral Distance:    Right Eye Near:   Left Eye Near:    Bilateral Near:     Physical Exam Constitutional:      Appearance: She is obese. She is ill-appearing.  HENT:     Head: Normocephalic and atraumatic.     Right Ear: Tympanic membrane and ear canal normal.     Left Ear: Tympanic membrane and ear canal normal.     Mouth/Throat:     Mouth: Mucous  membranes are moist.  Eyes:     General:        Right eye: Discharge present.     Extraocular Movements: Extraocular movements intact.     Comments: Upper and lower eyelid swelling w/copious, purulent drainage   Pulmonary:     Breath sounds: Wheezing and rhonchi present. No rales.     Comments: Increase effort with respirations. Expiratory wheezing present on exam. Chest:     Chest wall: No tenderness.  Lymphadenopathy:     Cervical: Cervical adenopathy present.  Neurological:     Mental Status: She is alert and oriented to person, place, and time.  Psychiatric:        Attention and Perception: Attention normal.        Mood and Affect: Mood normal.        Speech: Speech normal.      UC Treatments / Results  Labs (all labs ordered are listed, but only abnormal results are displayed) Labs Reviewed - No data to display  EKG   Radiology No results found.  Procedures Procedures (including critical care time)  Medications Ordered in UC Medications - No data to display  Initial Impression / Assessment and Plan / UC Course  I have reviewed the triage vital signs and the nursing notes.  Pertinent labs & imaging results that were available during my care of the patient were reviewed by me and considered in my medical decision making (see chart for details).    Hydrate well with fluids.  Treatment per medication discharge orders. If any of your symptoms become severe, go immediately to the emergency department. If symptoms do not improve return for evaluation or follow-up with PCP.  Final Clinical Impressions(s) / UC Diagnoses   Final diagnoses:  Contact blepharoconjunctivitis of right eye  Non-recurrent acute suppurative otitis media of left ear without spontaneous rupture of tympanic membrane  Acute bronchitis, unspecified organism     Discharge Instructions      Cough medications are often not covered by insurance, you can purchase out of pocket or purchase  over the counter Delsym for cough.  Start eye drops tomorrow, continue warm compresses. Start all other medications today. Follow-up with primary care doctor if symptoms worsen or do not improve.     ED Prescriptions     Medication Sig Dispense Auth. Provider  predniSONE (DELTASONE) 20 MG tablet Take 1 tablet (20 mg total) by mouth daily with breakfast for 5 days. 5 tablet Scot Jun, FNP   promethazine-dextromethorphan (PROMETHAZINE-DM) 6.25-15 MG/5ML syrup Take 5 mLs by mouth 3 (three) times daily as needed for cough. 180 mL Scot Jun, FNP   amoxicillin-clavulanate (AUGMENTIN) 875-125 MG tablet Take 1 tablet by mouth 2 (two) times daily for 10 days. 20 tablet Scot Jun, FNP   trimethoprim-polymyxin b (POLYTRIM) ophthalmic solution Place 2 drops into the right eye in the morning, at noon, and at bedtime for 7 days. 10 mL Scot Jun, FNP      PDMP not reviewed this encounter.   Scot Jun, [place] 07/03/21 2143

## 2021-06-28 NOTE — ED Triage Notes (Signed)
Pt presents with cough, runny nose, SOB and left ear pain x 2 days. She woke up this morning and her right eye was matted shut.

## 2021-11-21 ENCOUNTER — Encounter (INDEPENDENT_AMBULATORY_CARE_PROVIDER_SITE_OTHER): Payer: Self-pay

## 2021-12-30 ENCOUNTER — Other Ambulatory Visit: Payer: Self-pay | Admitting: Family Medicine

## 2021-12-30 DIAGNOSIS — N950 Postmenopausal bleeding: Secondary | ICD-10-CM

## 2022-01-05 ENCOUNTER — Ambulatory Visit
Admission: RE | Admit: 2022-01-05 | Discharge: 2022-01-05 | Disposition: A | Discharge status: Home / Self Care | Payer: Medicaid Other | Source: Ambulatory Visit | Attending: Family Medicine | Admitting: Family Medicine

## 2022-01-05 DIAGNOSIS — N950 Postmenopausal bleeding: Secondary | ICD-10-CM | POA: Insufficient documentation
